# Patient Record
Sex: Male | Born: 1960 | Race: White | Hispanic: No | Marital: Married | Smoking: Never smoker
Health system: Southern US, Academic
[De-identification: ages and names within clinical notes are randomized; demographics above are authoritative.]

## PROBLEM LIST (undated history)

## (undated) HISTORY — PX: HX BACK SURGERY: SHX140

---

## 1992-12-05 ENCOUNTER — Other Ambulatory Visit (HOSPITAL_COMMUNITY): Payer: Self-pay | Admitting: EXTERNAL

## 2020-09-09 IMAGING — MR MRI KNEE RT W/O CONTRAST
5 series · 40 of 40 positions shown · IV contrast (gadolinium)
Comparison: None available.

﻿EXAM:  [id number]   MRI KNEE RT W/O CONTRAST
INDICATION: Chronic pain.
TECHNIQUE: Multiplanar multisequential MRI of the right knee joint was performed without gadolinium contrast.

[Series 5: PD fat-sat · axial · right · 4.0mm · 0.53mm/px · z∈[-67,+63]mm · 8 of 30 slices shown (1 of 3)]
[im 1/30]
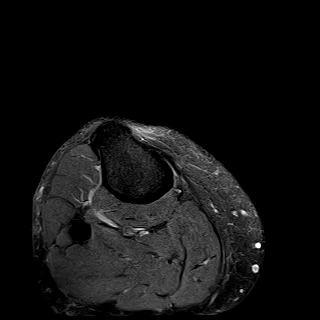
[im 5/30]
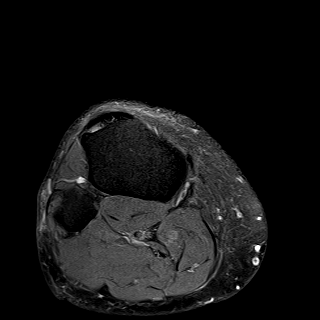
[im 9/30]
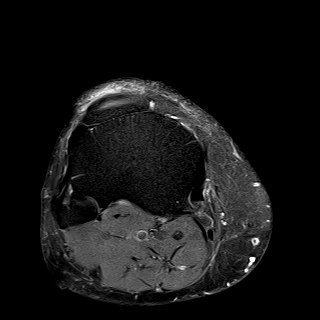
[im 13/30]
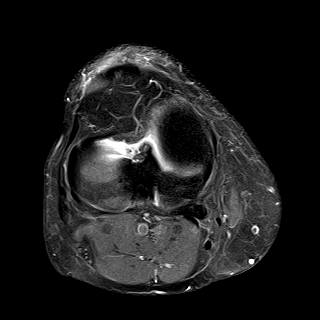
[im 17/30]
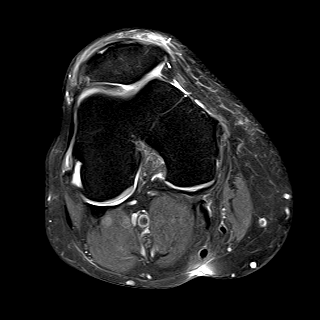
[im 21/30]
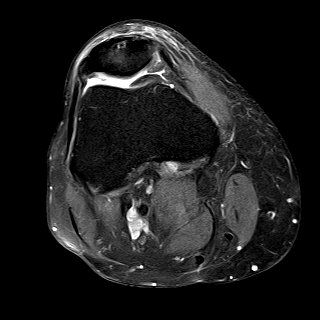
[im 25/30]
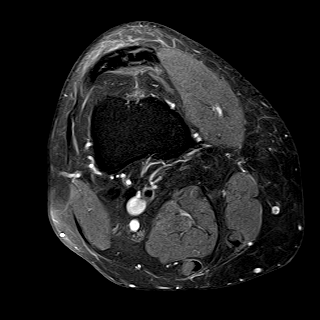
[im 30/30]
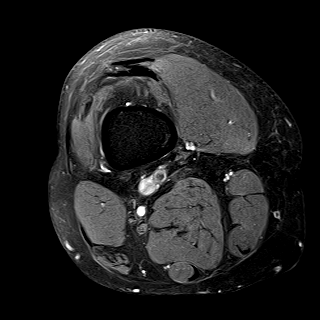

[Series 6: PD fat-sat · sagittal · right · 3.0mm · 0.47mm/px · 8 of 30 slices shown (2 of 3)]
[im 1/30]
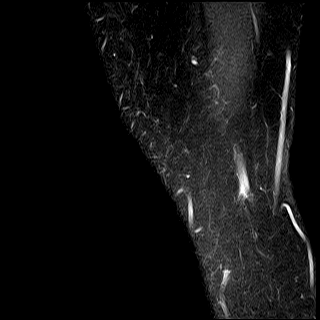
[im 5/30]
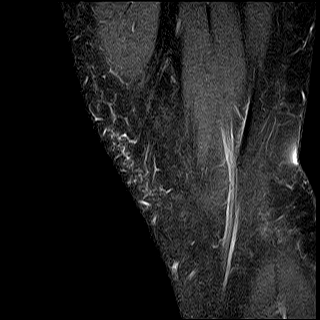
[im 9/30]
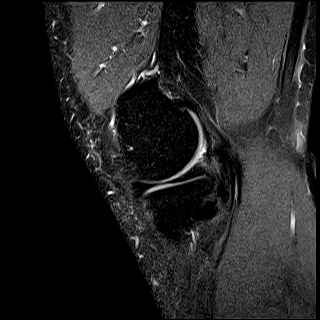
[im 13/30]
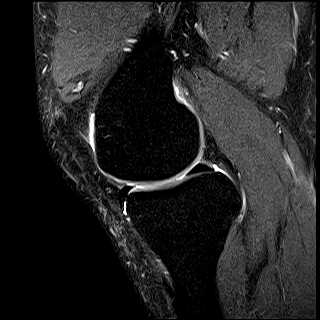
[im 17/30]
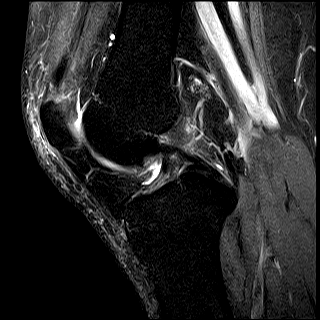
[im 21/30]
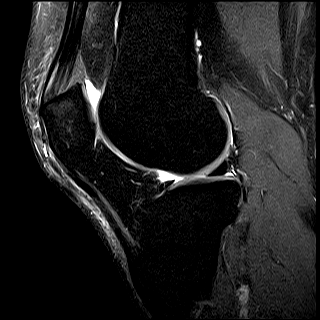
[im 25/30]
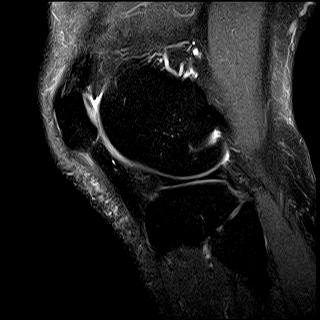
[im 30/30]
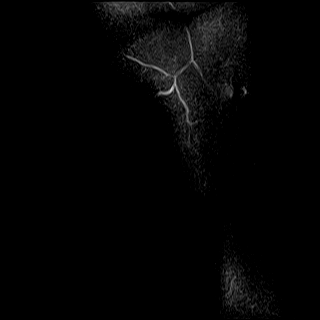

[Series 7: STIR · coronal · right · 3.0mm · 0.47mm/px · 8 of 29 slices shown]
[im 1/29]
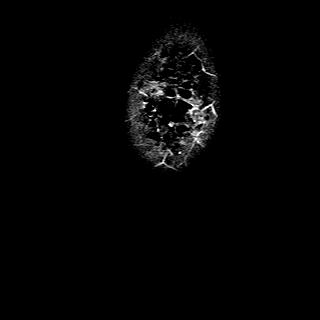
[im 5/29]
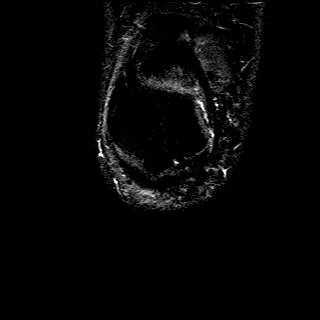
[im 9/29]
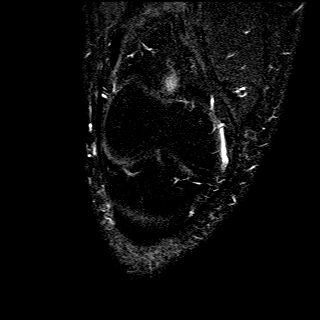
[im 13/29]
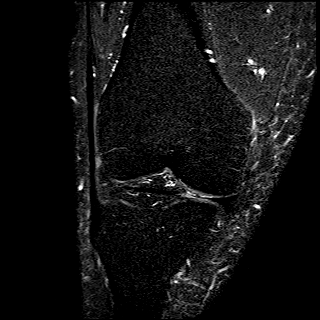
[im 17/29]
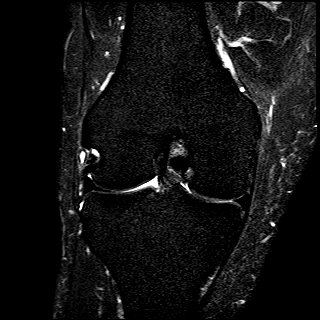
[im 21/29]
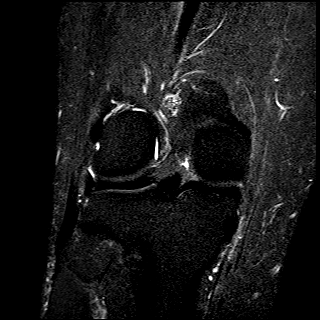
[im 25/29]
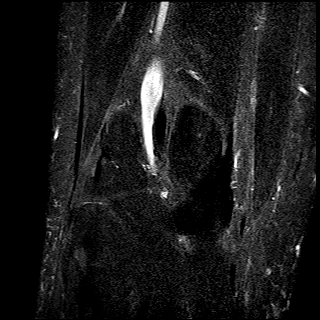
[im 29/29]
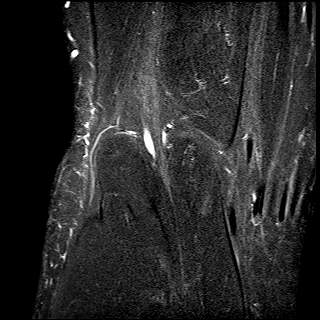

[Series 8: T1 · sagittal · right · 3.0mm · 0.39mm/px · 8 of 30 slices shown]
[im 1/30]
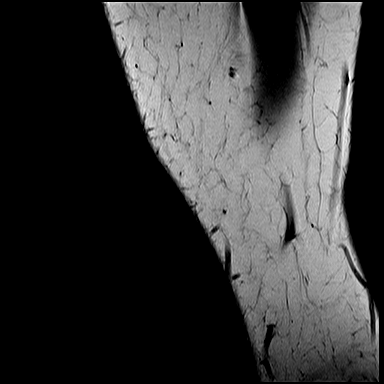
[im 5/30]
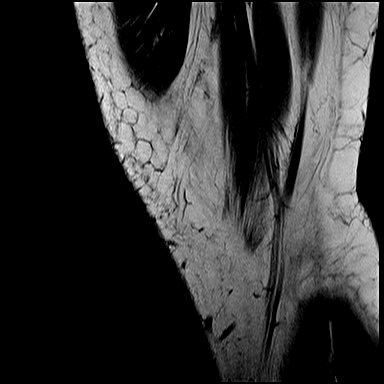
[im 9/30]
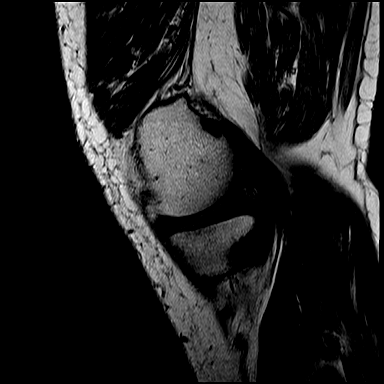
[im 13/30]
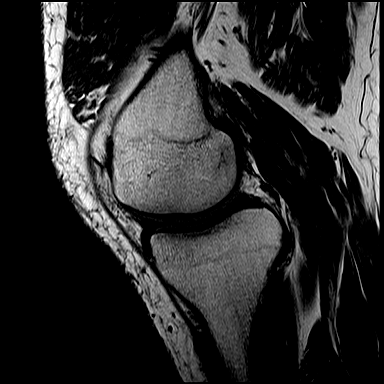
[im 17/30]
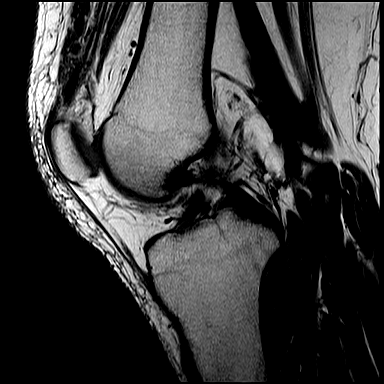
[im 21/30]
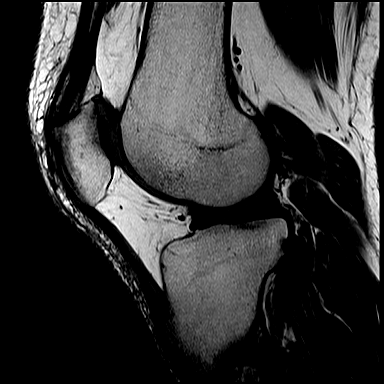
[im 25/30]
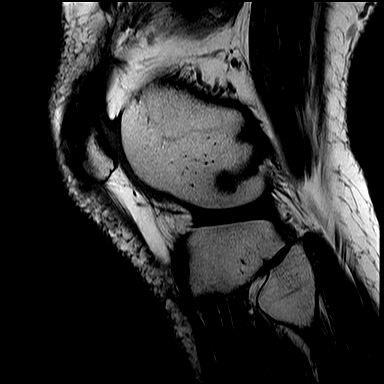
[im 30/30]
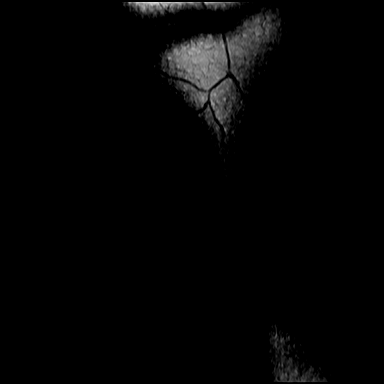

[Series 9: PD fat-sat · coronal · right · 3.0mm · 0.47mm/px · 8 of 29 slices shown (3 of 3)]
[im 1/29]
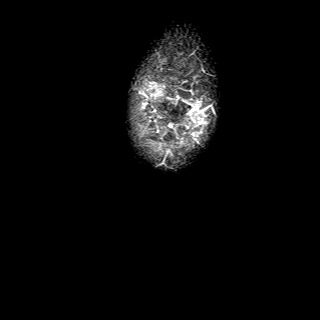
[im 5/29]
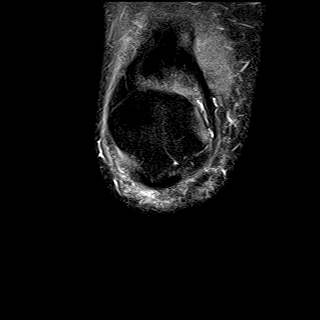
[im 9/29]
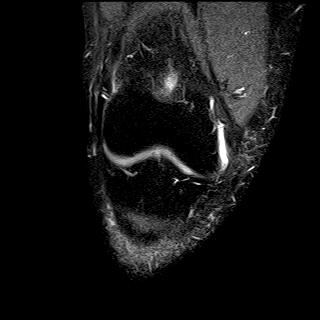
[im 13/29]
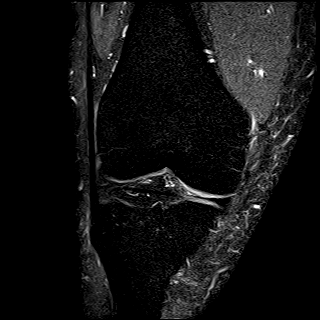
[im 17/29]
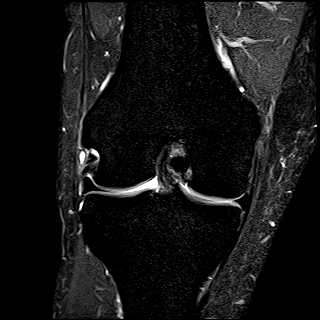
[im 21/29]
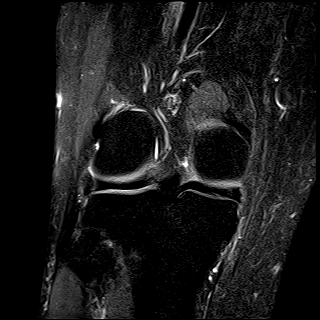
[im 25/29]
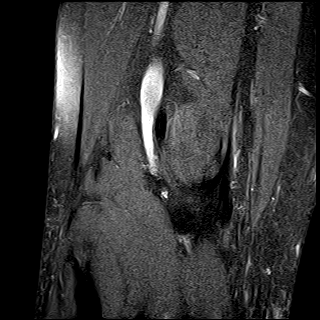
[im 29/29]
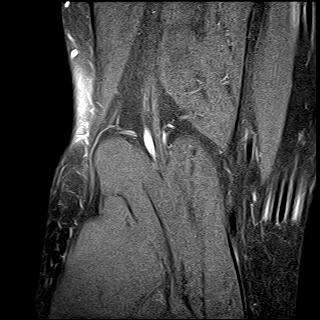

[40 of 40 positions shown; findings below may reference images not displayed]

FINDINGS: There is suggestion of a subtle tear involving the body of the medial meniscus. Lateral meniscus, cruciate and collateral ligaments are intact, within normal limits in morphology and signal intensity. Hyaline cartilage of the tibiofemoral and patellofemoral articulations is well maintained. Extensor mechanism is intact. There is moderate edema within the distal quadriceps tendon. Capsular attachments appear unremarkable. Bone marrow signal intensity is normal. There is no suprapatellar effusion or Baker's cyst.
IMPRESSION: 1. Suggestion of a subtle tear involving the body of the medial meniscus. 

2. Moderate distal quadriceps tendinopathy.

## 2020-09-09 IMAGING — MR MRI KNEE LT W/O CONTRAST
5 series · 40 of 40 positions shown · IV contrast (gadolinium)
Comparison: None available.

﻿EXAM:  [id number]   MRI KNEE LT W/O CONTRAST
INDICATION: Chronic pain.
TECHNIQUE: Multiplanar multisequential MRI of the left knee joint was performed without gadolinium contrast.

[Series 5: PD fat-sat · axial · left · 4.0mm · 0.53mm/px · z∈[-74,+57]mm · 8 of 30 slices shown (1 of 3)]
[im 1/30]
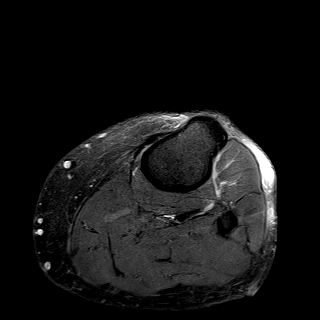
[im 5/30]
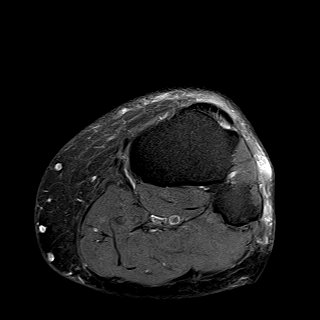
[im 9/30]
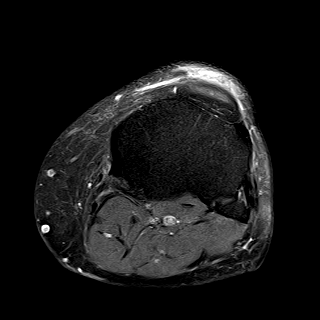
[im 13/30]
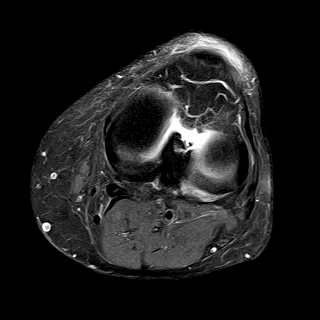
[im 17/30]
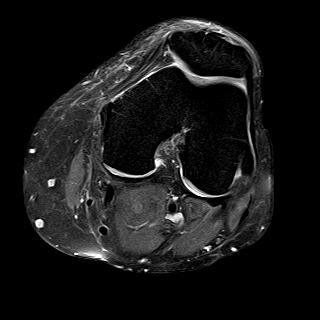
[im 21/30]
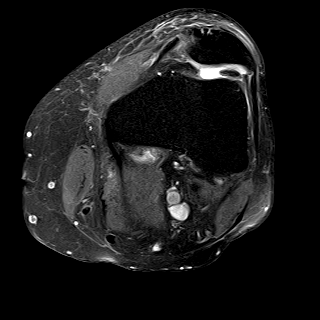
[im 25/30]
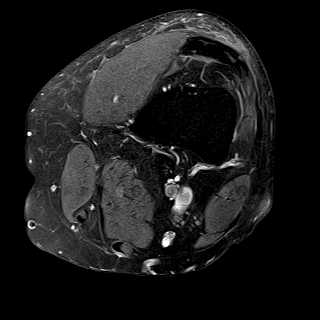
[im 30/30]
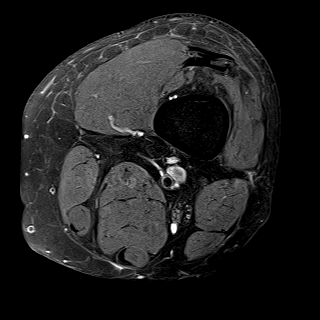

[Series 6: PD fat-sat · sagittal · left · 3.0mm · 0.47mm/px · 8 of 30 slices shown (2 of 3)]
[im 1/30]
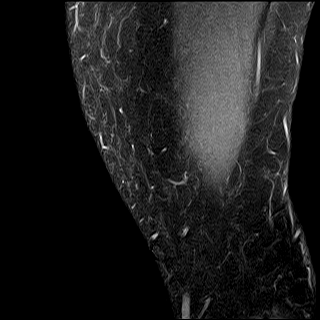
[im 5/30]
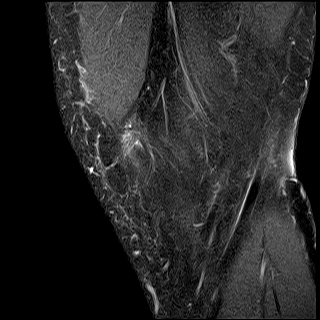
[im 9/30]
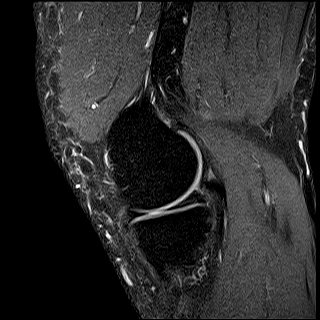
[im 13/30]
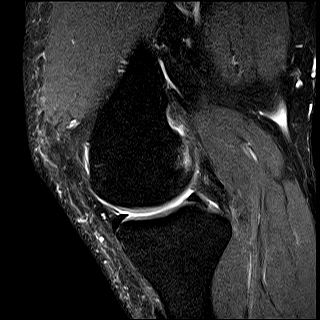
[im 17/30]
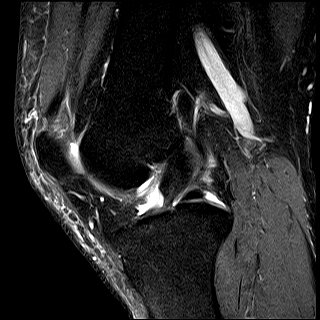
[im 21/30]
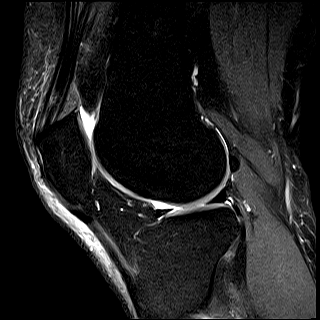
[im 25/30]
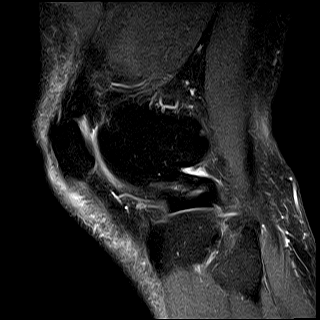
[im 30/30]
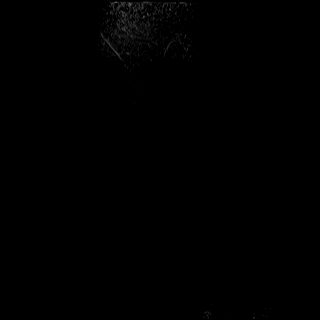

[Series 7: STIR · coronal · left · 3.0mm · 0.47mm/px · 8 of 29 slices shown]
[im 1/29]
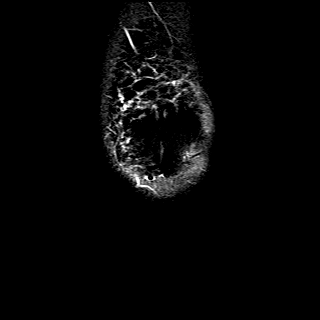
[im 5/29]
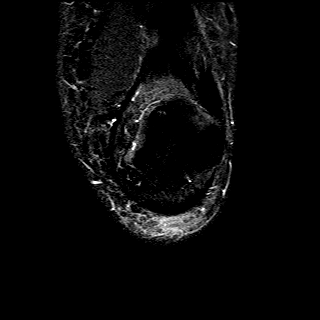
[im 9/29]
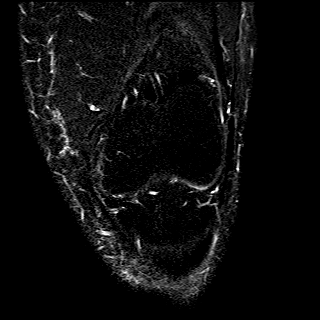
[im 13/29]
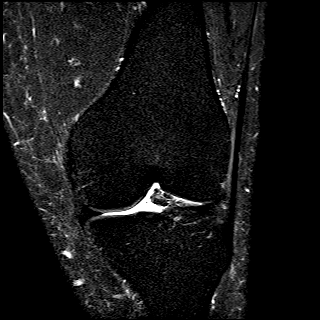
[im 17/29]
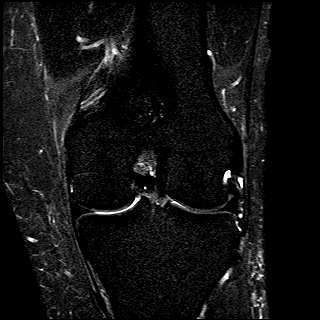
[im 21/29]
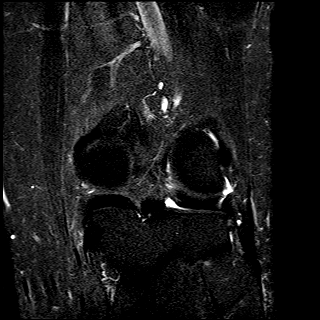
[im 25/29]
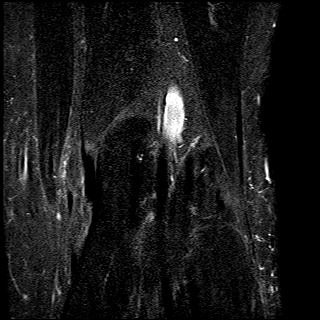
[im 29/29]
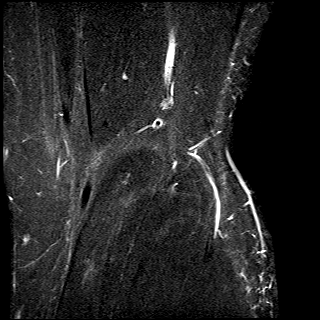

[Series 8: T1 · sagittal · left · 3.0mm · 0.39mm/px · 8 of 30 slices shown]
[im 1/30]
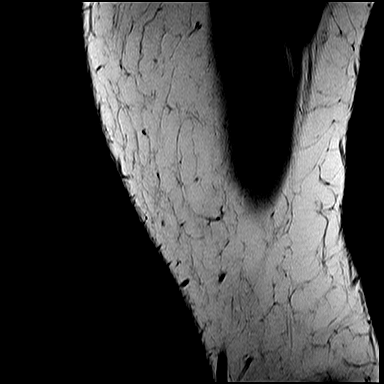
[im 5/30]
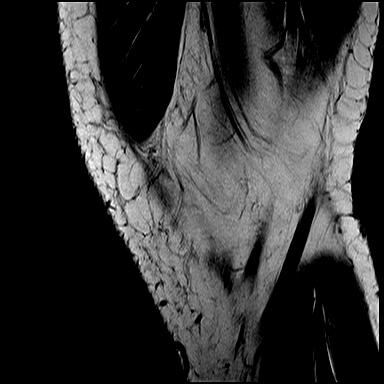
[im 9/30]
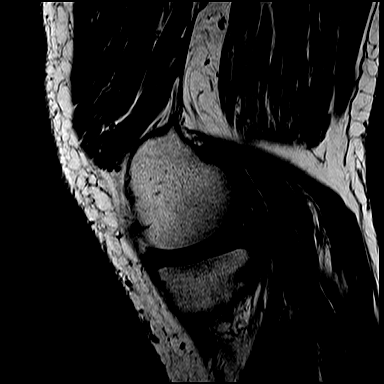
[im 13/30]
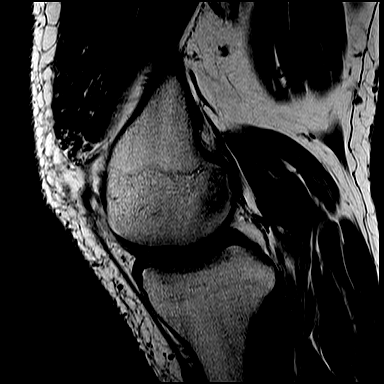
[im 17/30]
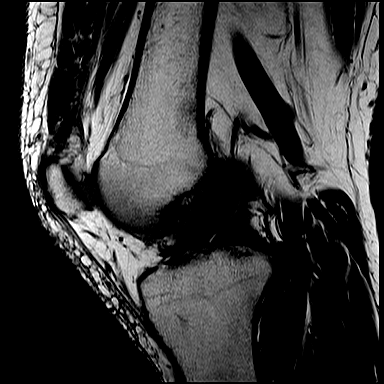
[im 21/30]
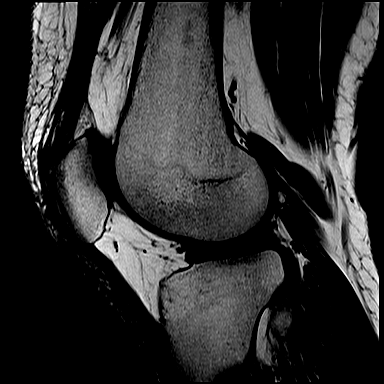
[im 25/30]
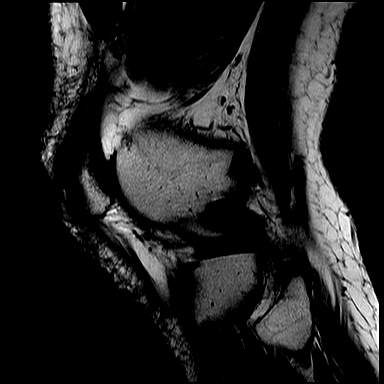
[im 30/30]
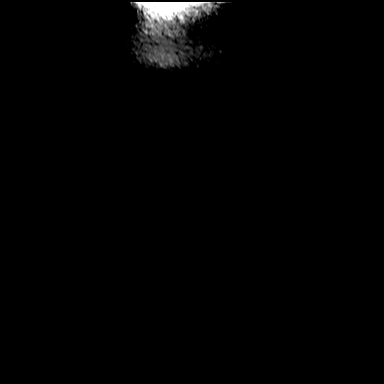

[Series 9: PD fat-sat · coronal · left · 3.0mm · 0.47mm/px · 8 of 29 slices shown (3 of 3)]
[im 1/29]
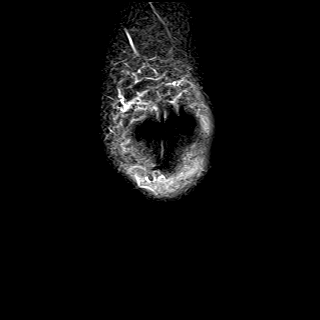
[im 5/29]
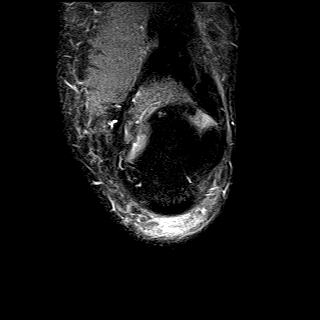
[im 9/29]
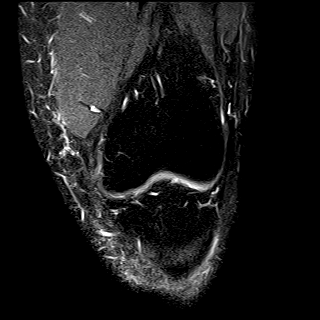
[im 13/29]
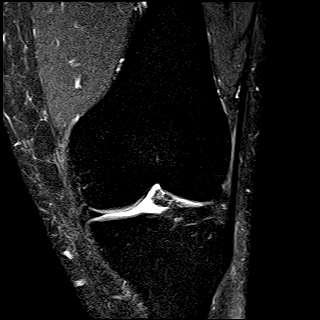
[im 17/29]
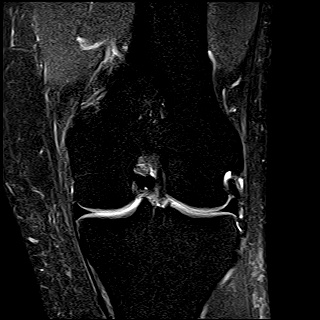
[im 21/29]
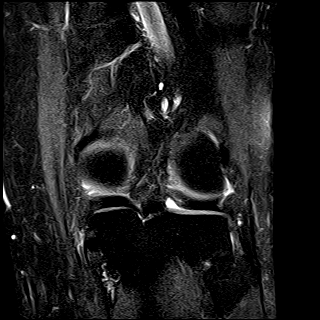
[im 25/29]
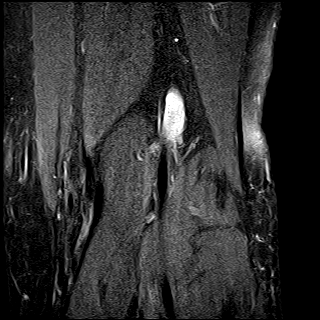
[im 29/29]
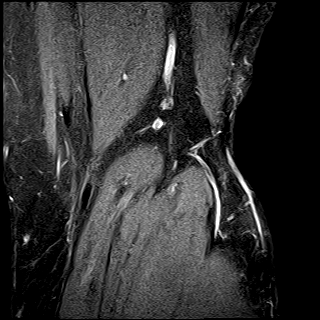

[40 of 40 positions shown; findings below may reference images not displayed]

FINDINGS: Menisci, cruciate and collateral ligaments are intact, within normal limits in morphology and signal intensity. Hyaline cartilage of the tibiofemoral and patellofemoral articulations is also well maintained. Extensor mechanism is intact. Capsular attachments appear unremarkable. Bone marrow signal intensity is normal. There is no suprapatellar effusion or Baker's cyst. There is mild prepatellar subcutaneous edema.
IMPRESSION: Mild prepatellar subcutaneous edema, otherwise unremarkable exam.

## 2020-09-25 IMAGING — MR MRI LUMBAR SPINE WITHOUT CONTRAST
6 series · 48 of 48 positions shown · IV contrast (gadolinium)
Comparison: None available.

﻿EXAM:  [id number]   MRI LUMBAR SPINE WITHOUT CONTRAST
INDICATION: Persistent lower back pain with bilateral lower extremity weakness.
TECHNIQUE: Multiplanar multisequential MRI of the lumbar spine was performed without gadolinium contrast. A non conventional body coil was utilized due to patient's extremely large body habitus.

[Series 4: T2 · coronal · 4.0mm · 1.34mm/px · 8 of 20 slices shown (1 of 3)]
[im 1/20]
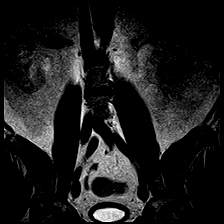
[im 3/20]
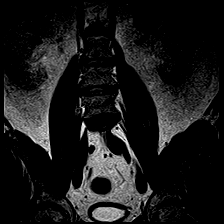
[im 6/20]
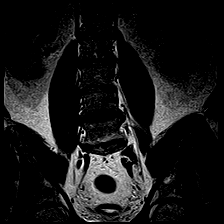
[im 9/20]
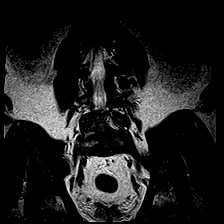
[im 11/20]
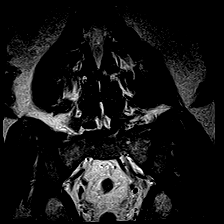
[im 14/20]
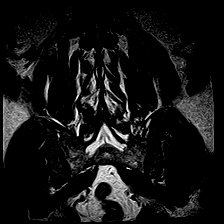
[im 17/20]
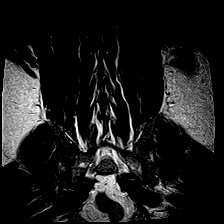
[im 20/20]
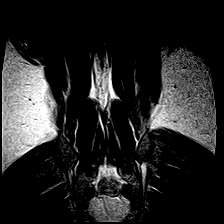

[Series 5: T2 · sagittal · 5.0mm · 1.06mm/px · 6 of 13 slices shown (2 of 3)]
[im 1/13]
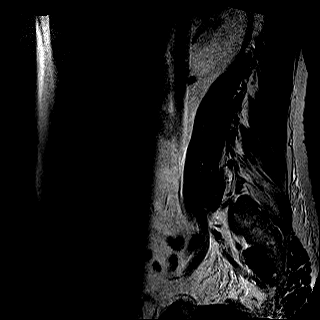
[im 3/13]
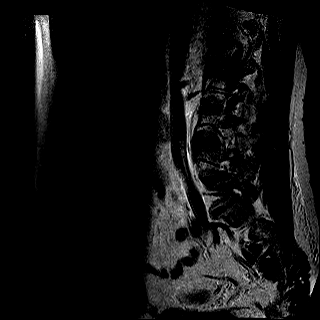
[im 5/13]
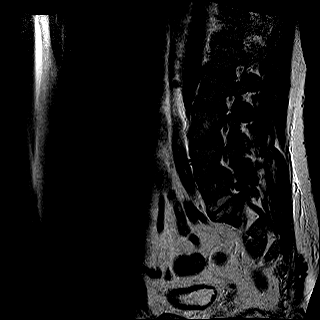
[im 8/13]
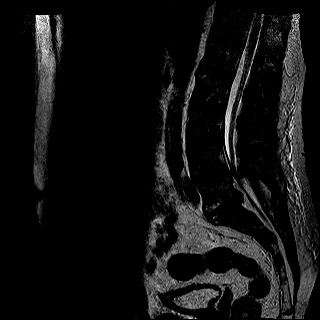
[im 10/13]
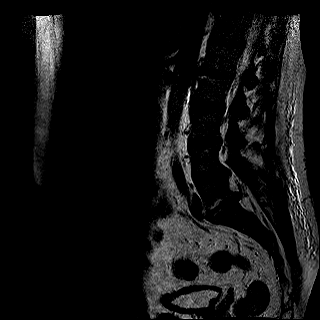
[im 13/13]
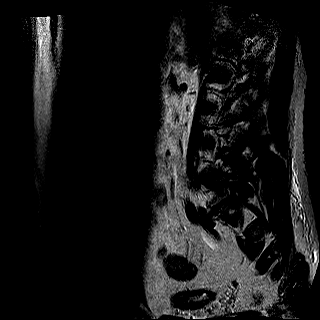

[Series 6: T1 · sagittal · 5.0mm · 1.06mm/px · 6 of 13 slices shown (1 of 2)]
[im 1/13]
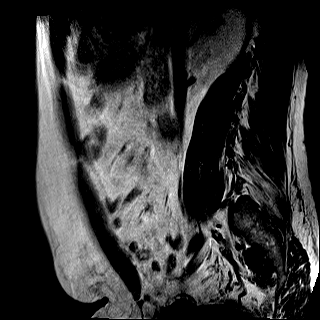
[im 3/13]
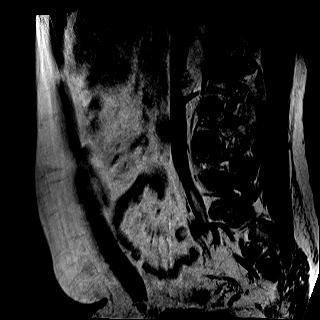
[im 5/13]
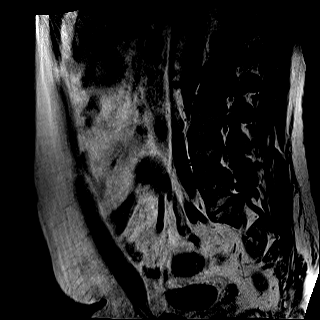
[im 8/13]
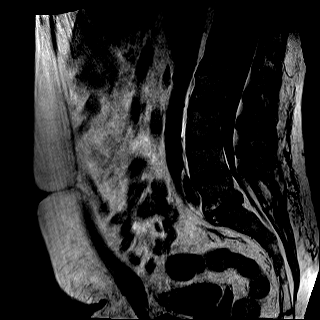
[im 10/13]
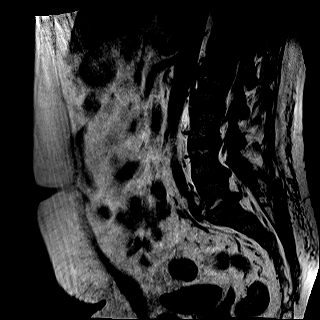
[im 13/13]
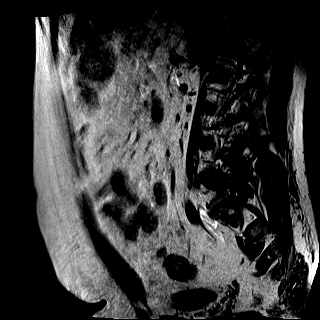

[Series 8: STIR · sagittal · 5.0mm · 1.33mm/px · 6 of 13 slices shown]
[im 1/13]
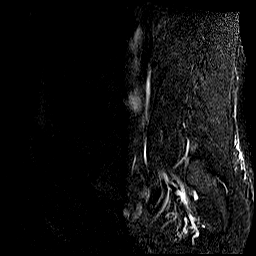
[im 3/13]
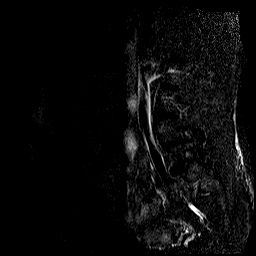
[im 5/13]
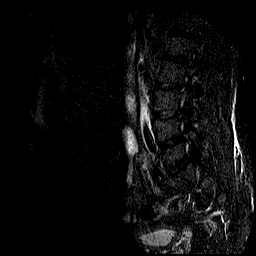
[im 8/13]
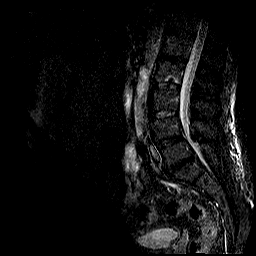
[im 10/13]
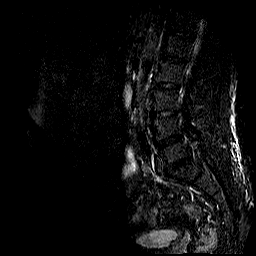
[im 13/13]
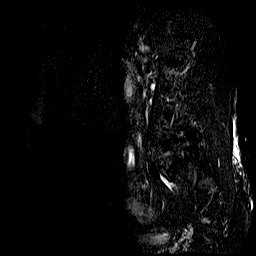

[Series 9: T2 · axial · 5.0mm · 0.89mm/px · z∈[-64,+139]mm · 11 of 25 slices shown (3 of 3)]
[im 1/25]
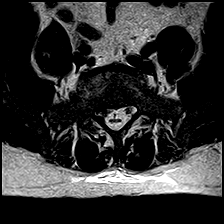
[im 3/25]
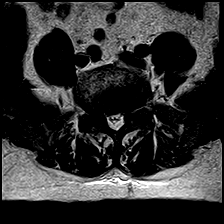
[im 5/25]
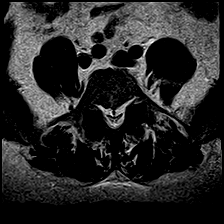
[im 8/25]
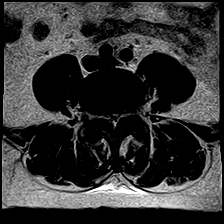
[im 10/25]
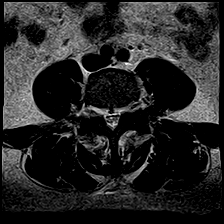
[im 13/25]
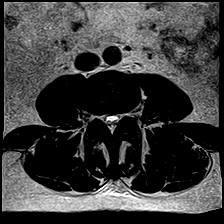
[im 15/25]
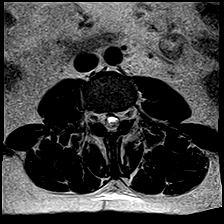
[im 17/25]
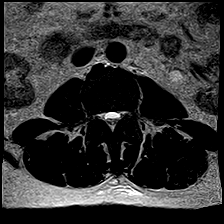
[im 20/25]
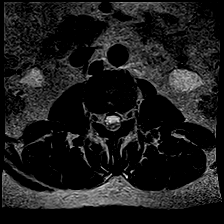
[im 22/25]
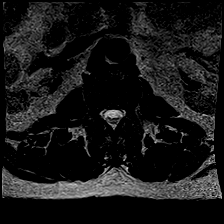
[im 25/25]
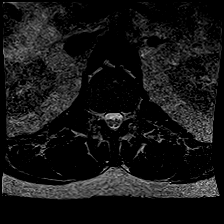

[Series 10: T1 · axial · 5.0mm · 0.89mm/px · z∈[-64,+139]mm · 11 of 25 slices shown (2 of 2)]
[im 1/25]
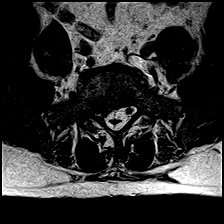
[im 3/25]
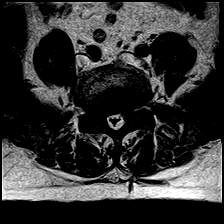
[im 5/25]
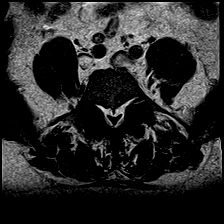
[im 8/25]
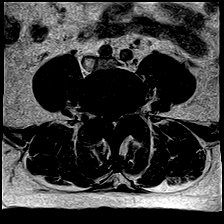
[im 10/25]
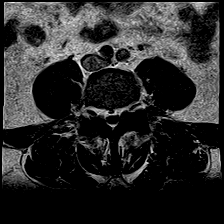
[im 13/25]
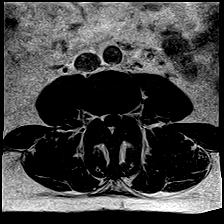
[im 15/25]
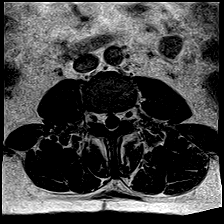
[im 17/25]
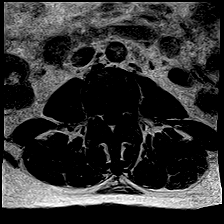
[im 20/25]
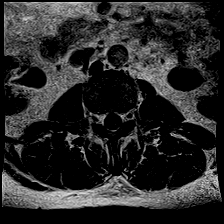
[im 22/25]
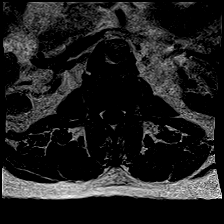
[im 25/25]
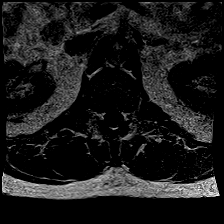

[48 of 48 positions shown; findings below may reference images not displayed]

FINDINGS: Vertebral bodies are normal in height, alignment and signal intensity. There is no acute fracture or subluxation. Distal spinal cord is normal in signal intensity and terminates normally at T12-L1 disc space level. Spinal canal is congenitally narrow.

L1-2, L2-3 and L3-4 levels are unremarkable.

At L4-5 level, is a minimal bulging annulus, minimally abutting the ventral thecal sac. There is severe spinal stenosis from epidural lipomatosis. There is mild-to-moderate bilateral neural foraminal stenosis from facet arthropathy and bulging annulus.

At L5-S1 level, there is marked disc desiccation. Modic type 2 changes are also noted within the inferior endplate of L5 vertebral body. There is a small broad-based central disc bulge without mass effect on the thecal sac. There is severe spinal stenosis from epidural lipomatosis. There is moderate to severe bilateral neural foraminal stenosis from facet arthropathy and bulging annulus.

Paraspinal soft tissues are unremarkable.
IMPRESSION: 1. No significant disc herniation at any level. 

2. Severe spinal stenosis at L4-5 and L5-S1 levels from epidural lipomatosis. 

3. Multilevel neural foraminal stenosis as detailed above.

## 2020-09-25 IMAGING — MR MRI CERVICAL SPINE WITHOUT CONTRAST
6 of 7 series · 28 of 48 positions shown · IV contrast (gadolinium)
Comparison: None available.

﻿EXAM:  [id number]   MRI CERVICAL SPINE WITHOUT CONTRAST
INDICATION: Neck pain with left upper extremity radiculopathy.
TECHNIQUE: Multiplanar multisequential MRI of the cervical spine was performed without gadolinium contrast.

[Series 4: s-map · sagittal · 8.8mm · 4.38mm/px · 8 of 100 slices shown]
[im 5/100]
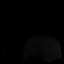
[im 18/100]
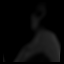
[im 31/100]
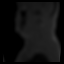
[im 44/100]
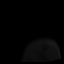
[im 56/100]
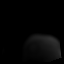
[im 69/100]
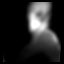
[im 82/100]
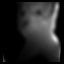
[im 95/100]
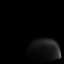

[Series 5: T2 · sagittal · 3.0mm · 0.75mm/px · 4 of 15 slices shown (1 of 3)]
[im 1/15]
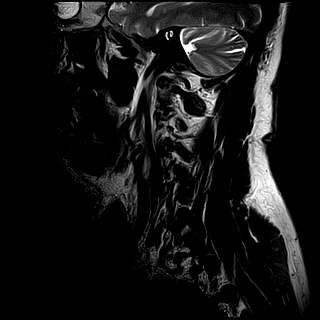
[im 5/15]
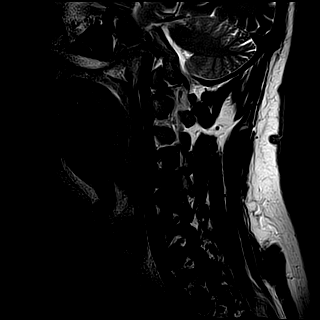
[im 10/15]
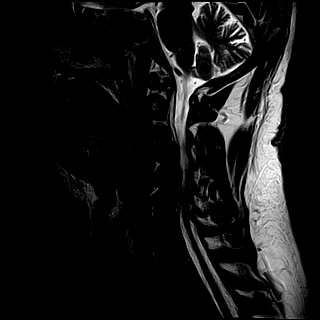
[im 15/15]
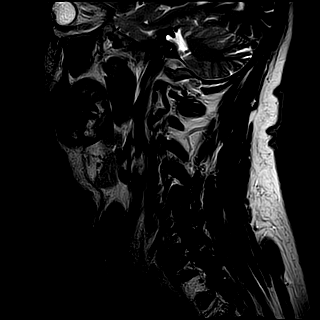

[Series 6: T1 · sagittal · 3.0mm · 0.47mm/px · 4 of 15 slices shown]
[im 1/15]
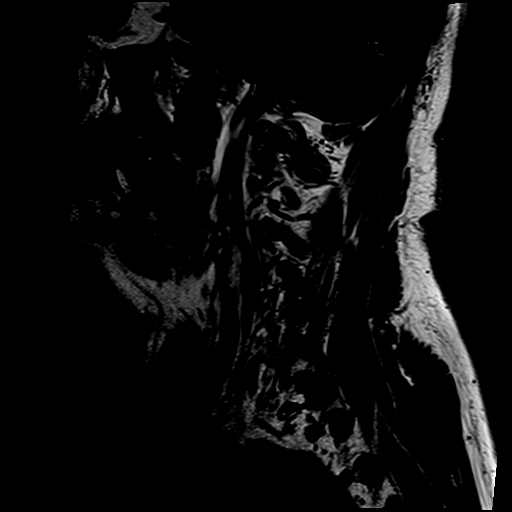
[im 5/15]
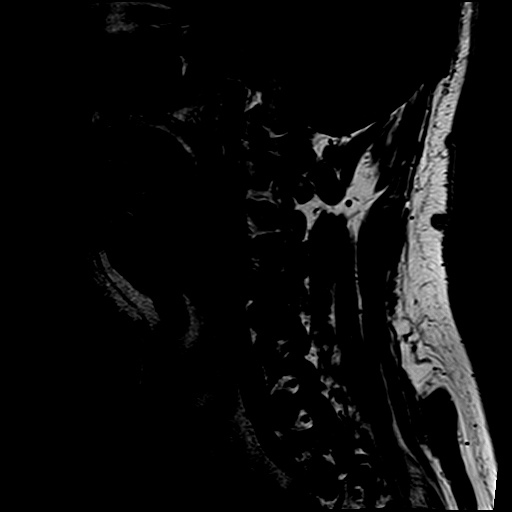
[im 10/15]
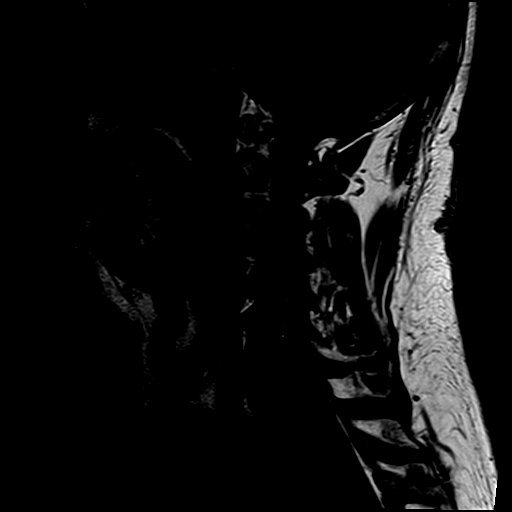
[im 15/15]
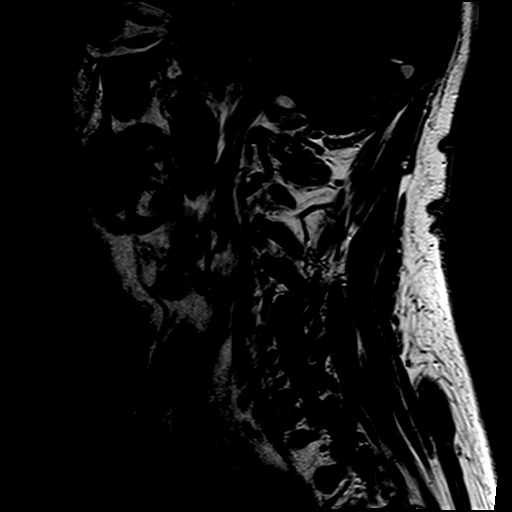

[Series 8: STIR · sagittal · 3.0mm · 0.47mm/px · 4 of 15 slices shown]
[im 1/15]
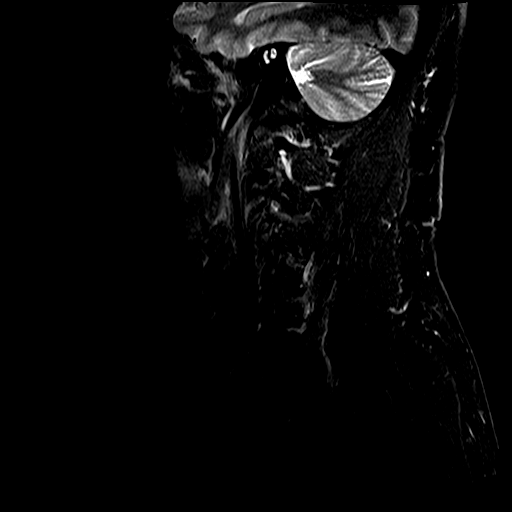
[im 5/15]
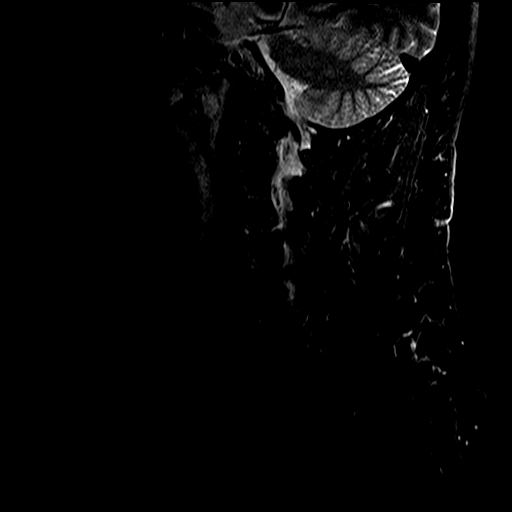
[im 10/15]
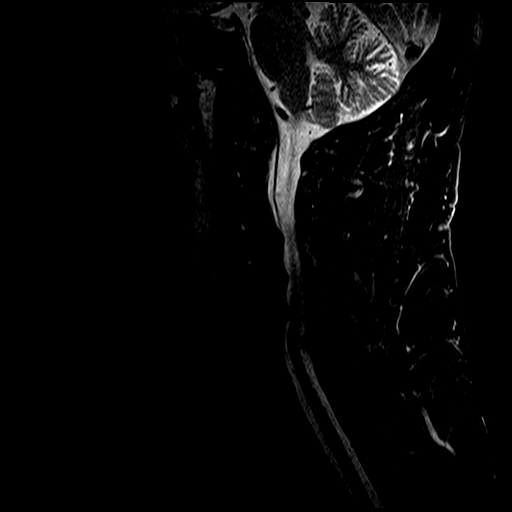
[im 15/15]
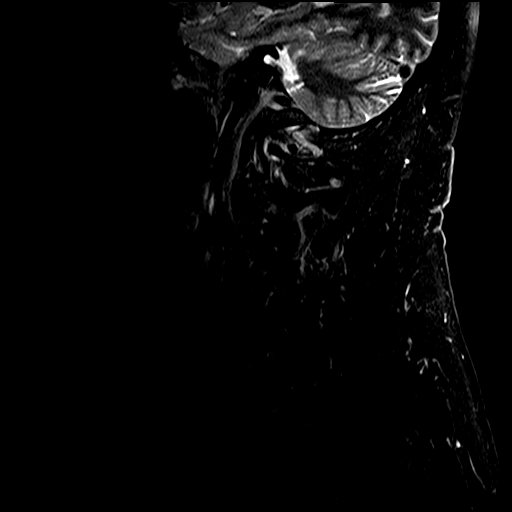

[Series 9: T2 · axial · 3.0mm · 0.39mm/px · z∈[-106,+2]mm · 4 of 18 slices shown (2 of 3)]
[im 1/18]
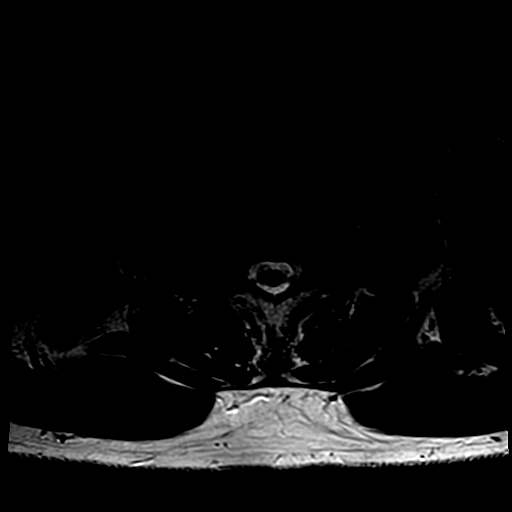
[im 6/18]
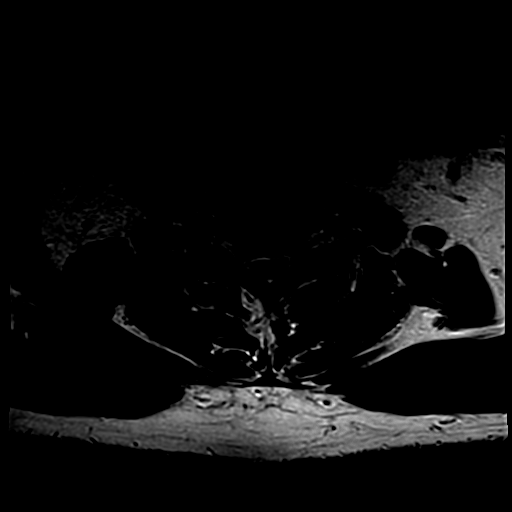
[im 12/18]
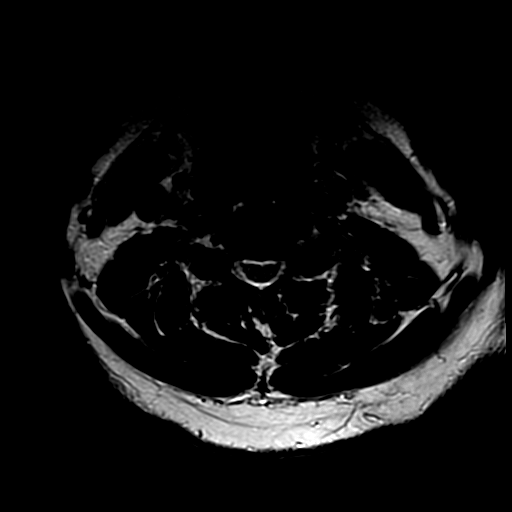
[im 18/18]
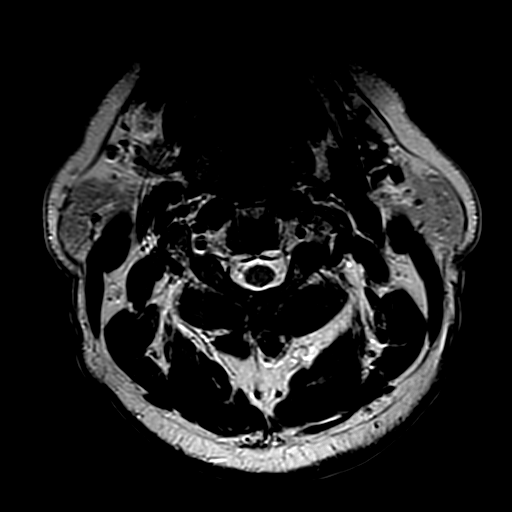

[Series 10: T2 · axial · 3.0mm · 0.35mm/px · z∈[-2,+58]mm · 4 of 16 slices shown (3 of 3)]
[im 1/16]
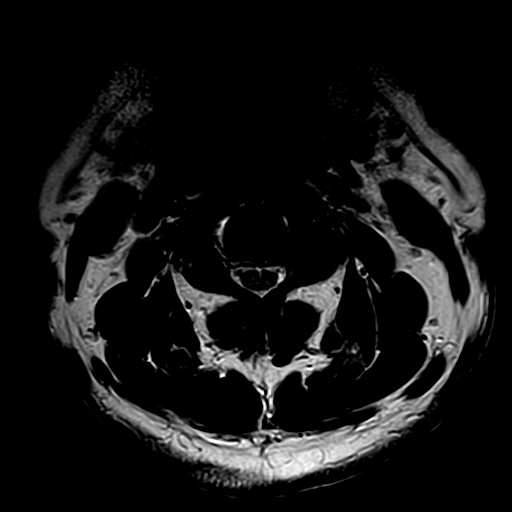
[im 6/16]
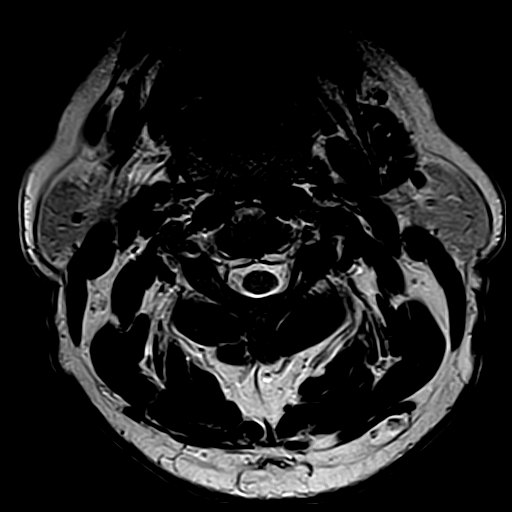
[im 11/16]
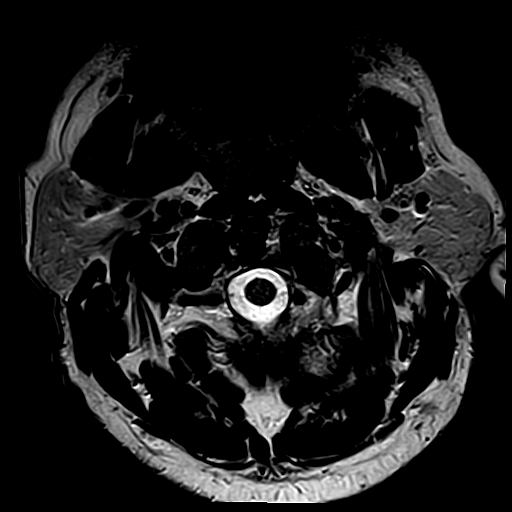
[im 16/16]
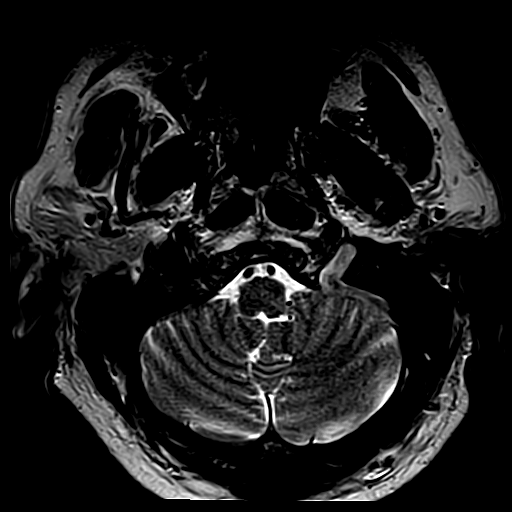

[28 of 48 positions shown; findings below may reference images not displayed]

FINDINGS: Reversal of cervical lordosis is likely positional. Bone marrow signal intensity is normal. There is no acute fracture or subluxation. Visualized spinal cord is normal in signal intensity without evidence of compression at any level.

C2-3 level is unremarkable.

At C3-4 level, there is a minimal bulging annulus, minimally effacing the ventral CSF. There is severe right neural foraminal stenosis from facet and uncovertebral joint hypertrophy.

At C4-5 level, there is a small broad-based central disc bulge with near complete effacement of the ventral CSF. There is severe bilateral neural foraminal stenosis from facet and uncovertebral joint hypertrophy.

At C5-6 level, there is a small broad-based central disc osteophyte complex resulting in mild-to-moderate spinal stenosis. There is severe bilateral neural foraminal stenosis from facet and uncovertebral joint hypertrophy.

At C6-7 level, there is a small broad-based central disc bulge partially effacing the ventral CSF. There is mild left neural foraminal stenosis from uncovertebral joint hypertrophy.

C7-T1 level and paraspinal soft tissues are unremarkable.
IMPRESSION: 1. Mild-to-moderate spinal stenosis C5-6 level from a small central disc osteophyte complex. No spinal cord signal abnormality is seen at this time to suggest cord edema or myelomalacia. Continued close clinical follow-up and neurosurgical evaluation are recommended. 

2. Multilevel neural foraminal stenosis as detailed above.

## 2020-10-29 IMAGING — CR XRAY CERVICAL SPINE MINIMUM 4 VIEWS
1 series · 4 of 4 positions shown · non-contrast
Comparison: None available.

﻿EXAM:  [id number]      XRAY CERVICAL SPINE MINIMUM 4 VIEWS
INDICATION: Neck pain.

[Series 1: view not recorded · 0.17mm/px · 4 of 4 slices shown]
[im 1/4]
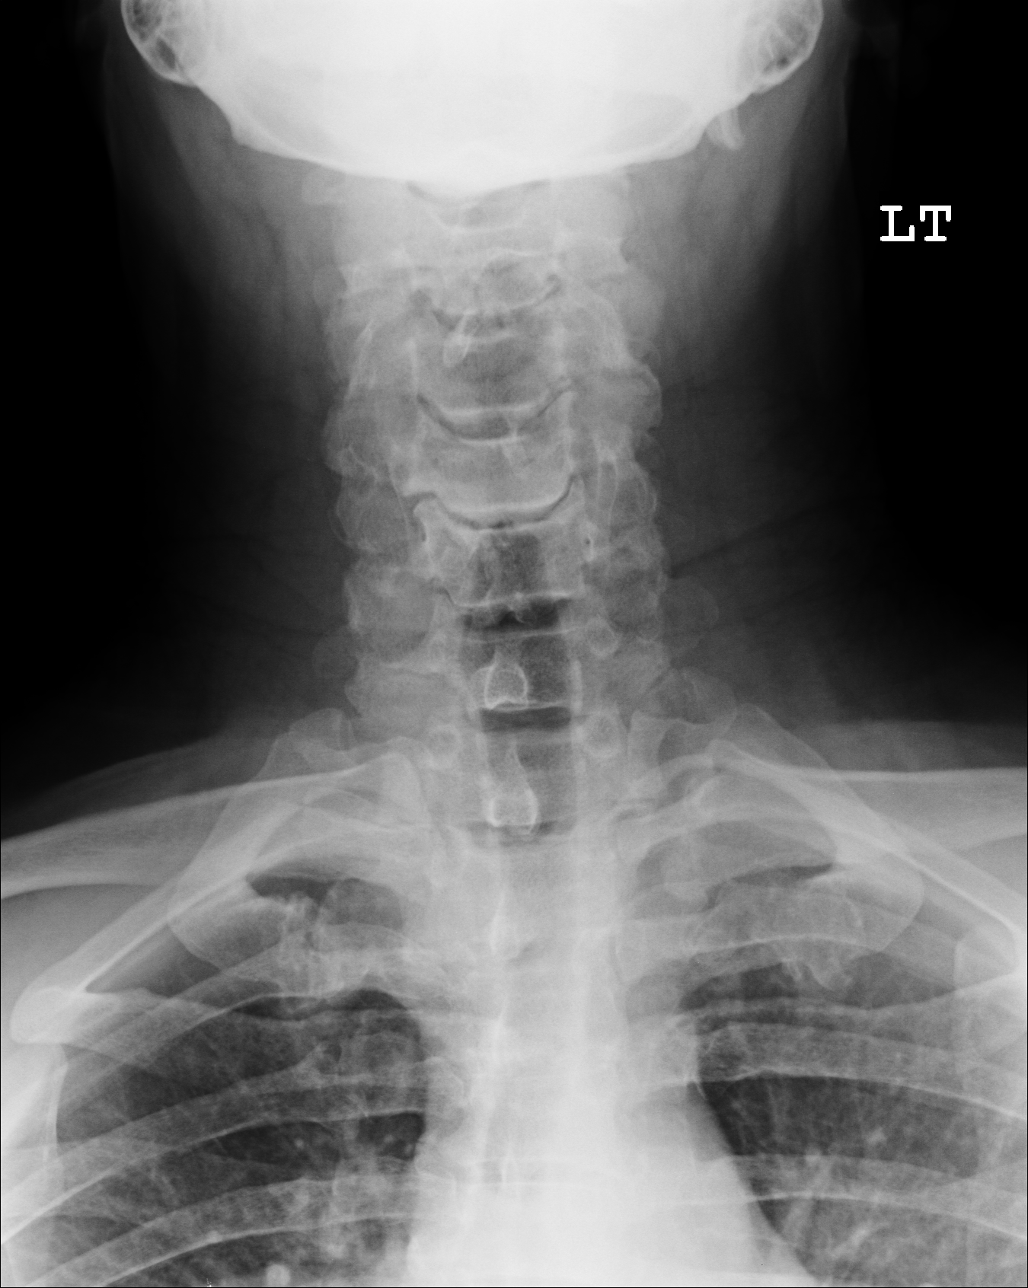
[im 2/4]
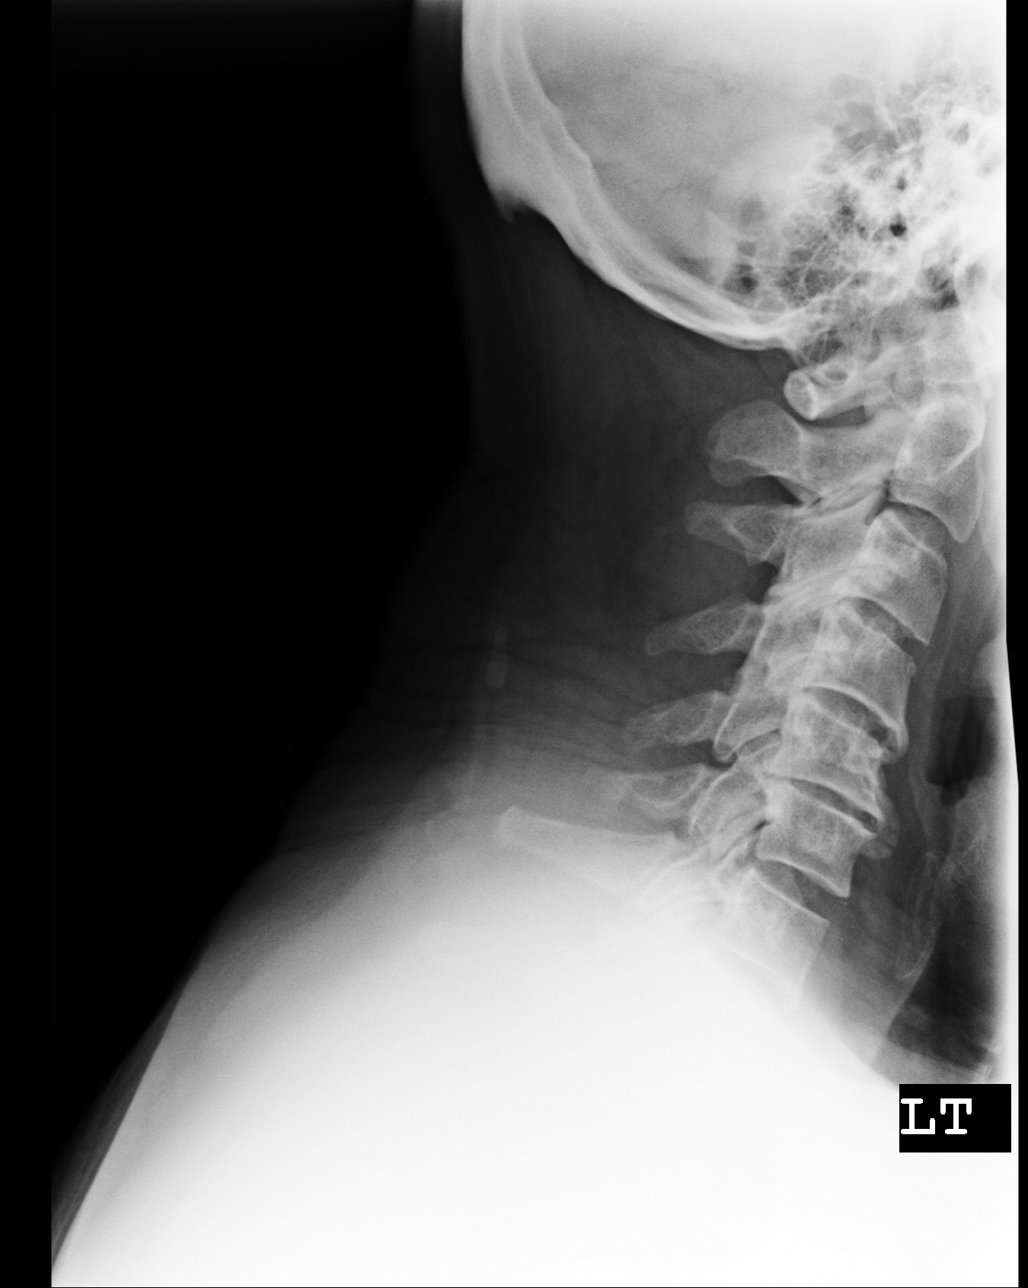
[im 3/4]
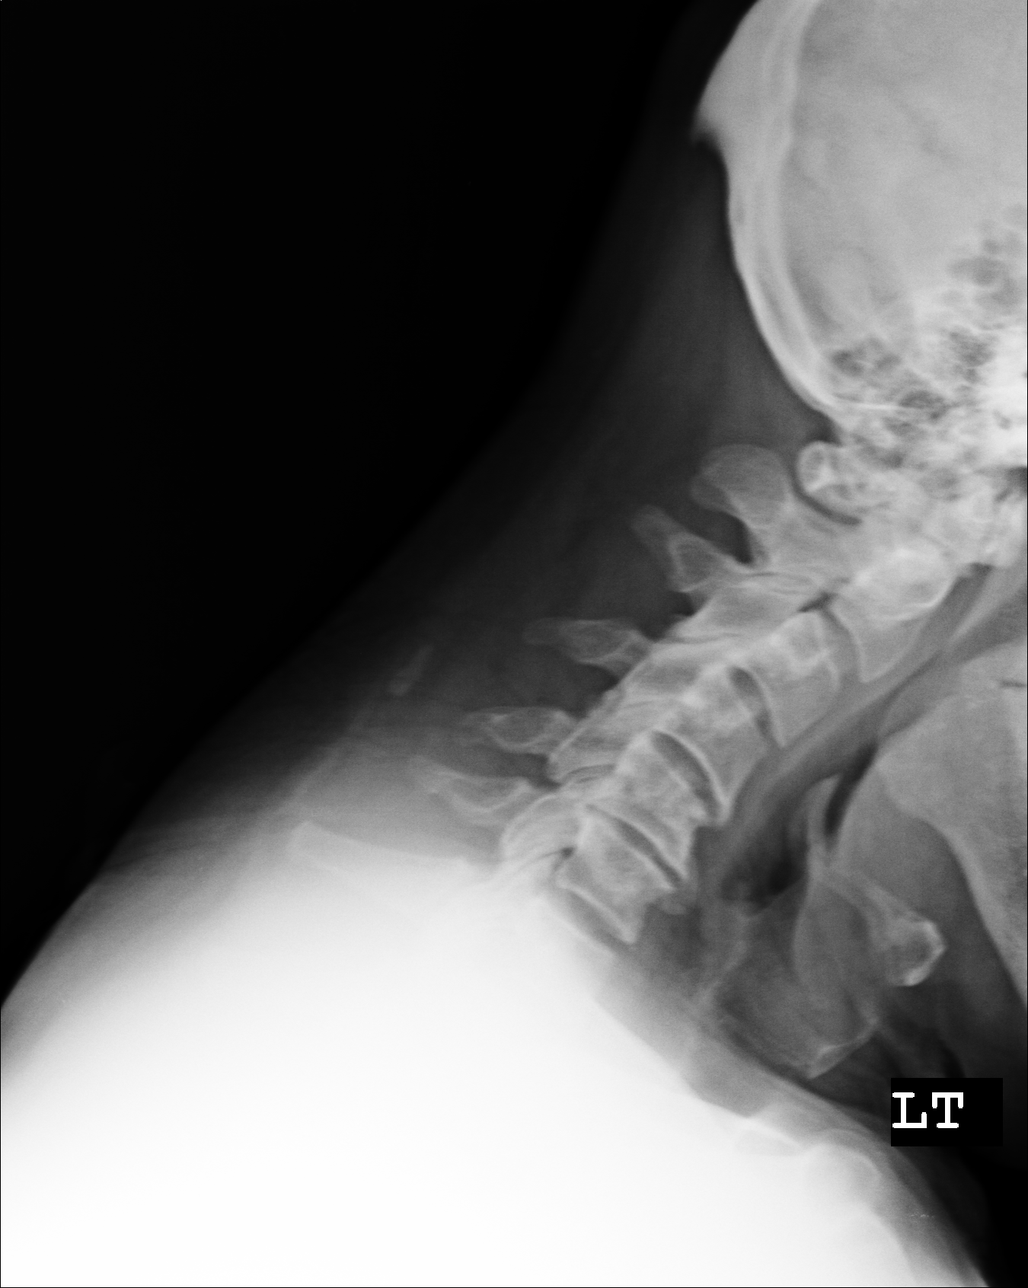
[im 4/4]
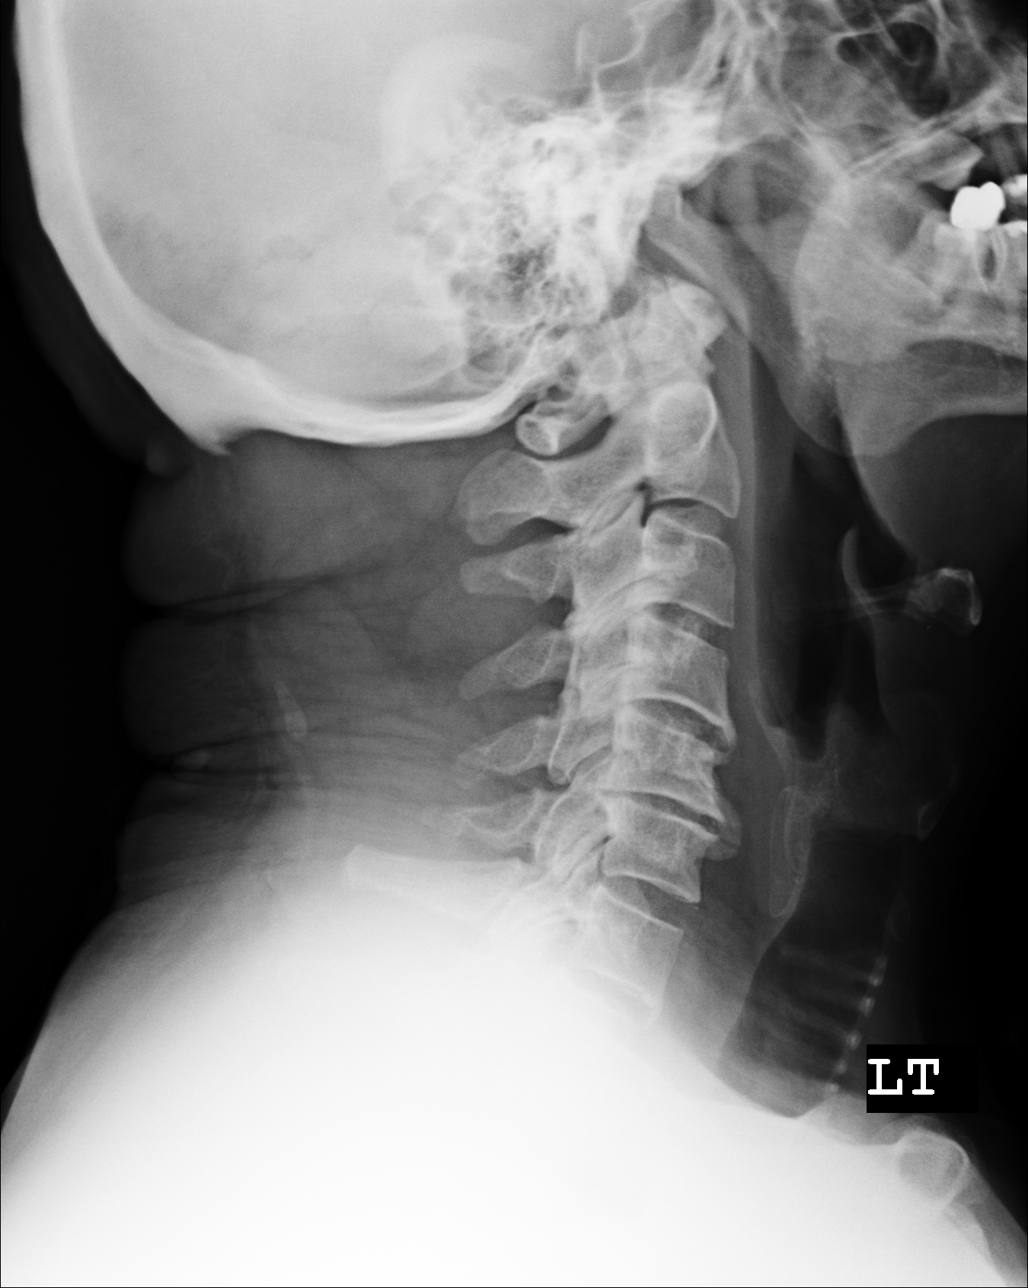

[4 of 4 positions shown; findings below may reference images not displayed]

FINDINGS: Mild reversal of cervical lordosis is likely positional. There is no acute fracture or subluxation. There is moderate degenerative disc disease at C5-6 level. No definite abnormality is seen on the flexion and extension views. Atlantoaxial articulation appears well maintained. There is no prevertebral soft tissue swelling.
IMPRESSION: Degenerative changes as detailed above.

## 2020-10-29 IMAGING — CR XRAY KNEE 4 OR MORE VIEWS LT
1 series · 3 of 3 positions shown · non-contrast
Comparison: None available.

﻿EXAM:  XRAY KNEE 4 OR MORE VIEWS LT,XRAY KNEE 4 OR MORE VIEWS RT
INDICATION: Bilateral knee pain.

[Series 1: view not recorded · 0.17mm/px · 3 of 3 slices shown]
[im 1/3]
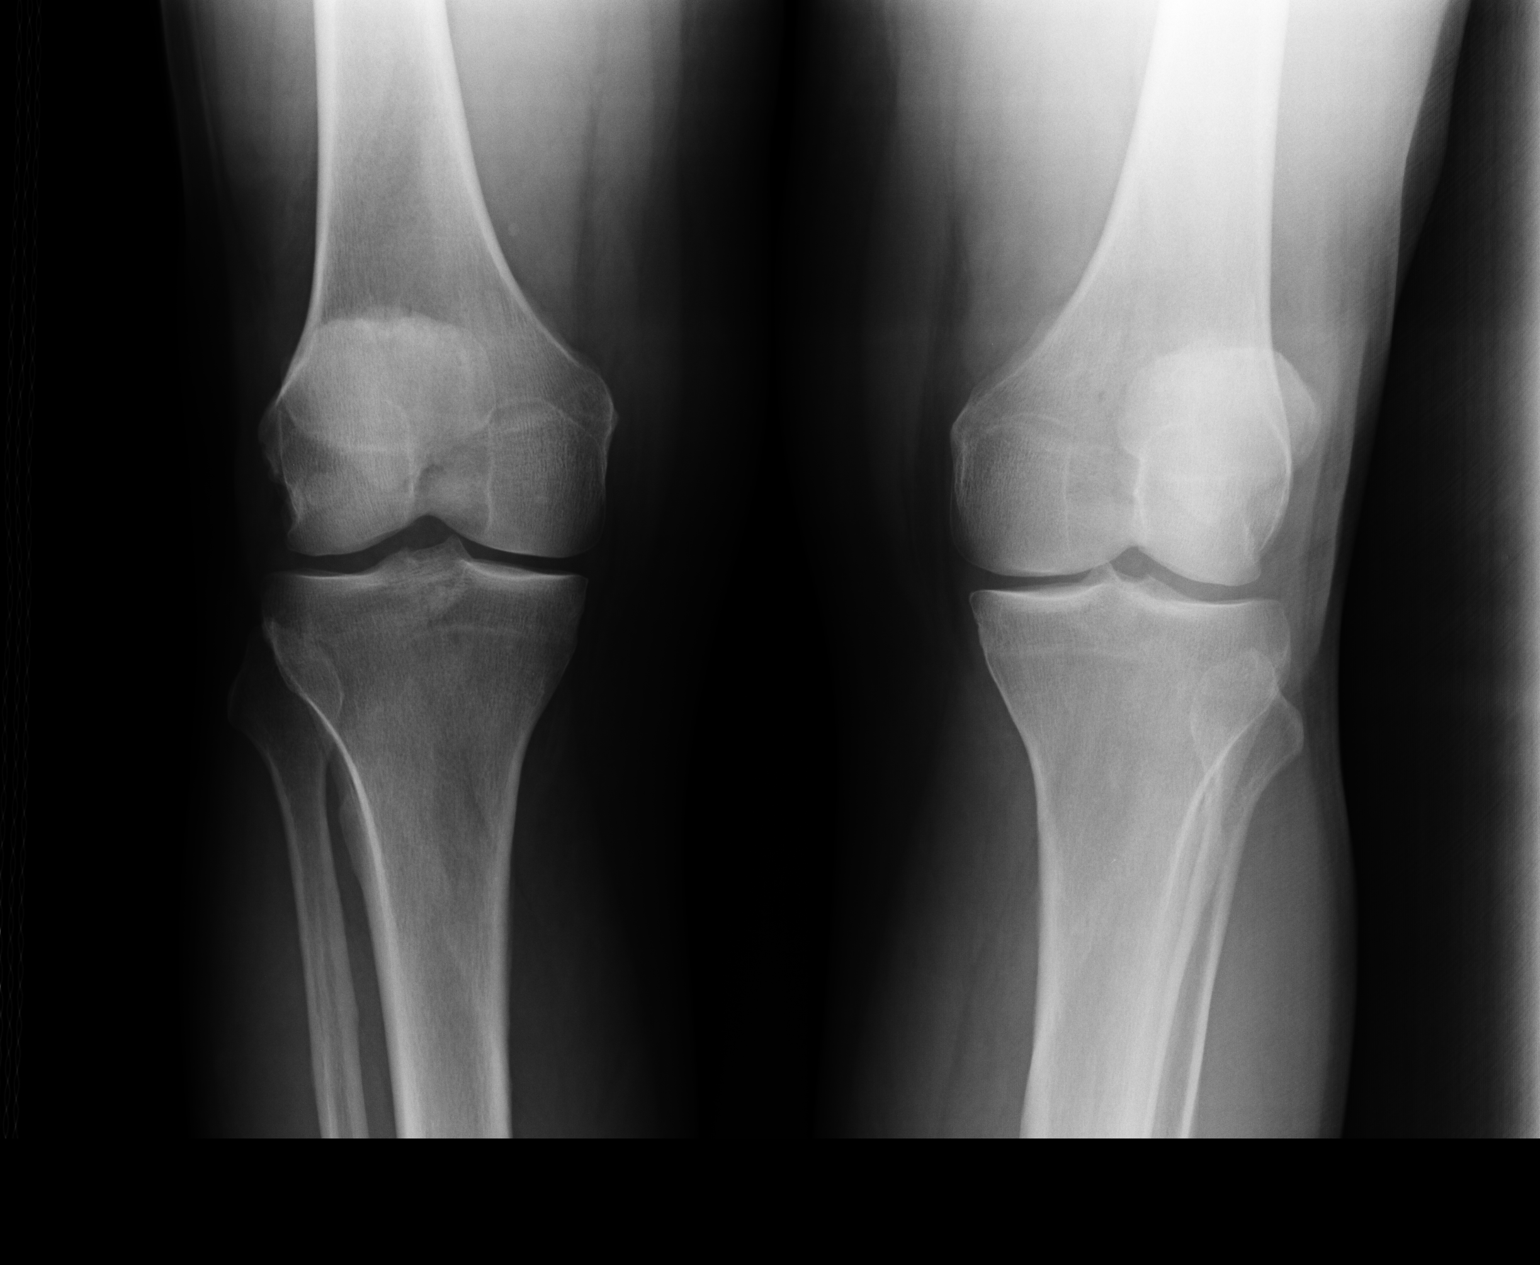
[im 2/3]
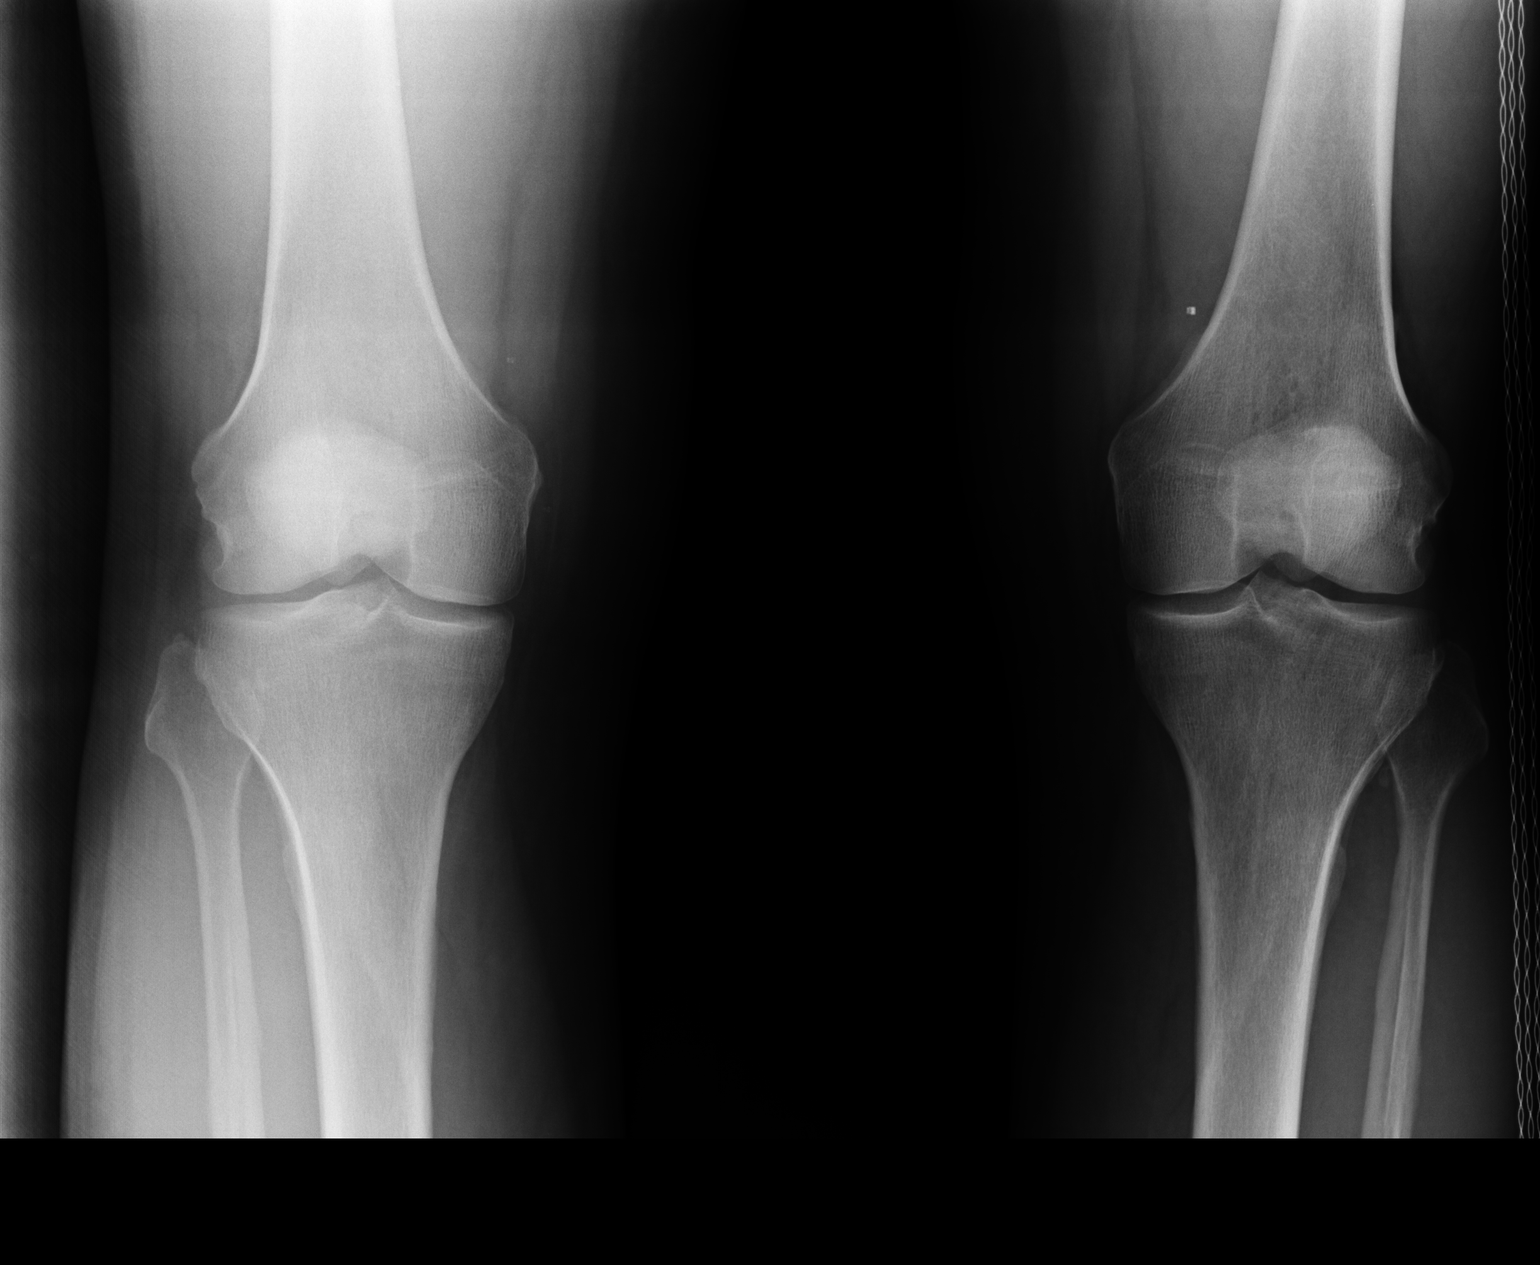
[im 3/3]
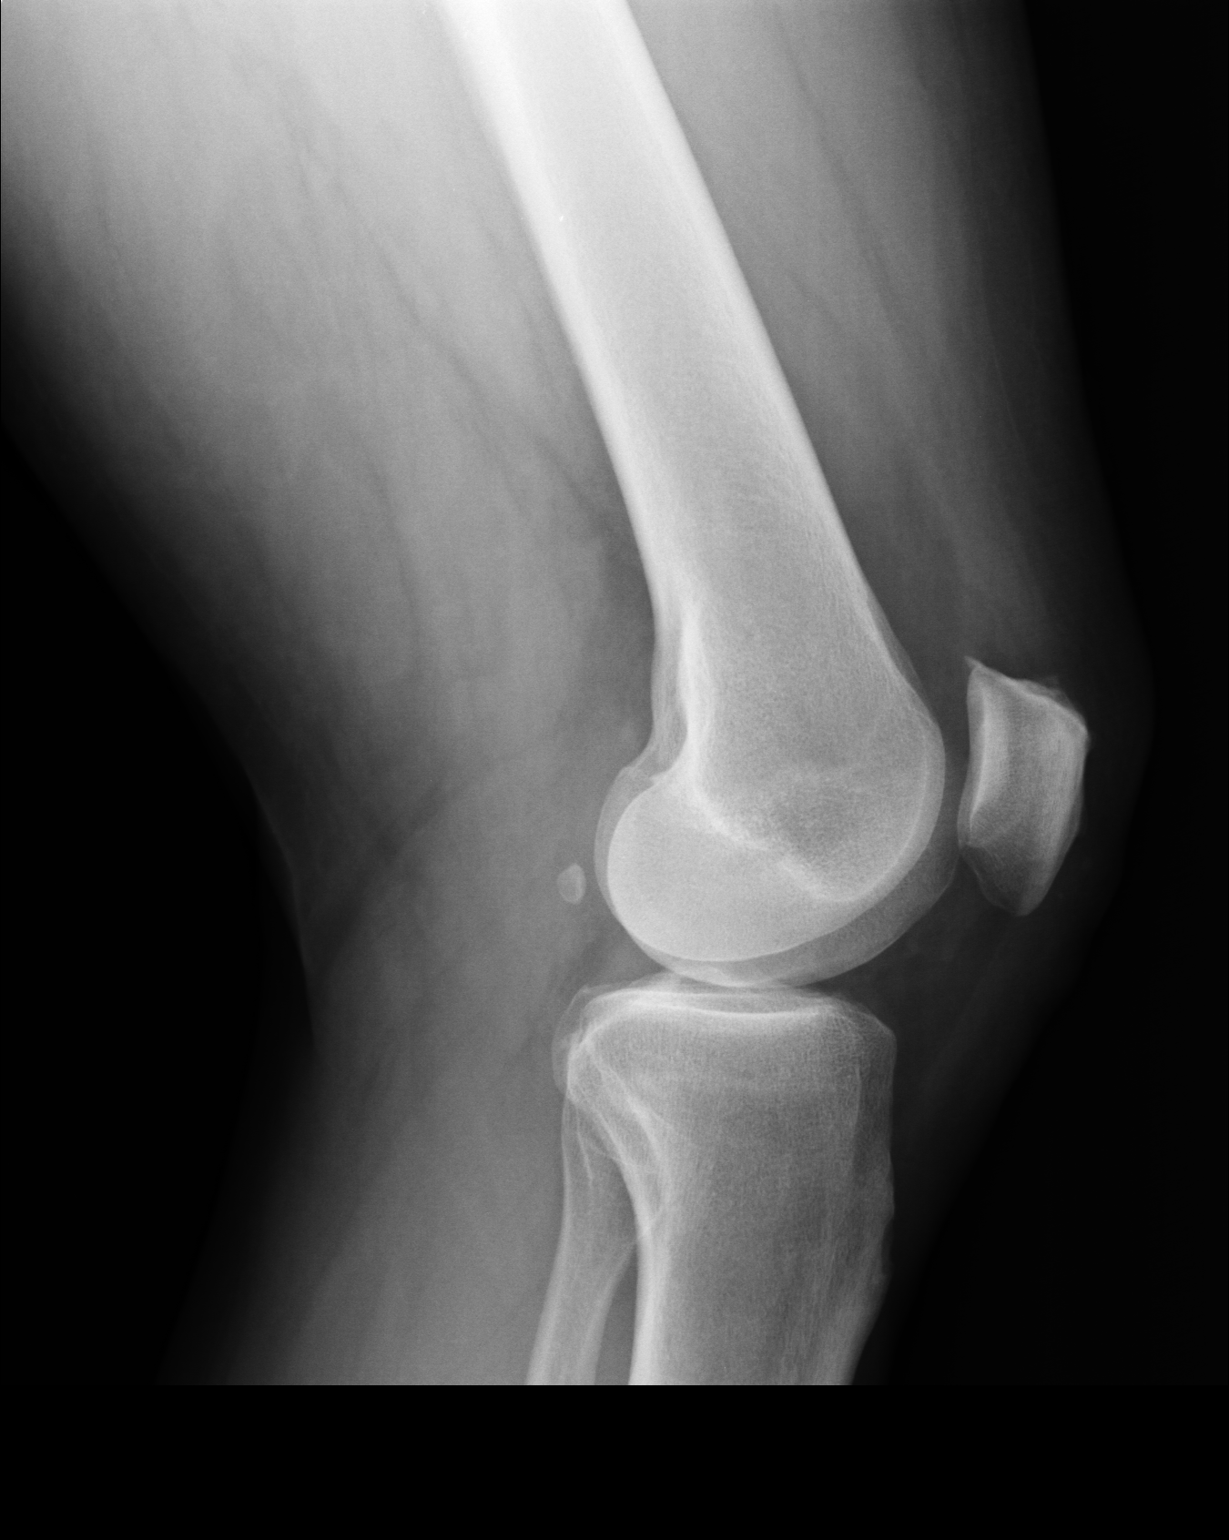

[3 of 3 positions shown; findings below may reference images not displayed]

FINDINGS: There is no acute fracture or subluxation. Tibiofemoral articulations are well maintained. There is mild bilateral patellofemoral joint space narrowing. There is no suprapatellar effusion on either side. There is no soft tissue abnormality.
IMPRESSION: Mild bilateral patellofemoral joint space narrowing.

## 2020-10-29 IMAGING — CR SCOLIOSIS EVAL 1 VIEW
1 series · 2 of 2 positions shown · non-contrast
Comparison: None available.

﻿EXAM:  [id number]      XRAY THORACIC SPINE THORACOLUMBAR JUNCTION 2 VIEWS
INDICATION: Back pain.

[Series 1: view not recorded · 0.17mm/px · 2 of 2 slices shown]
[im 1/2]
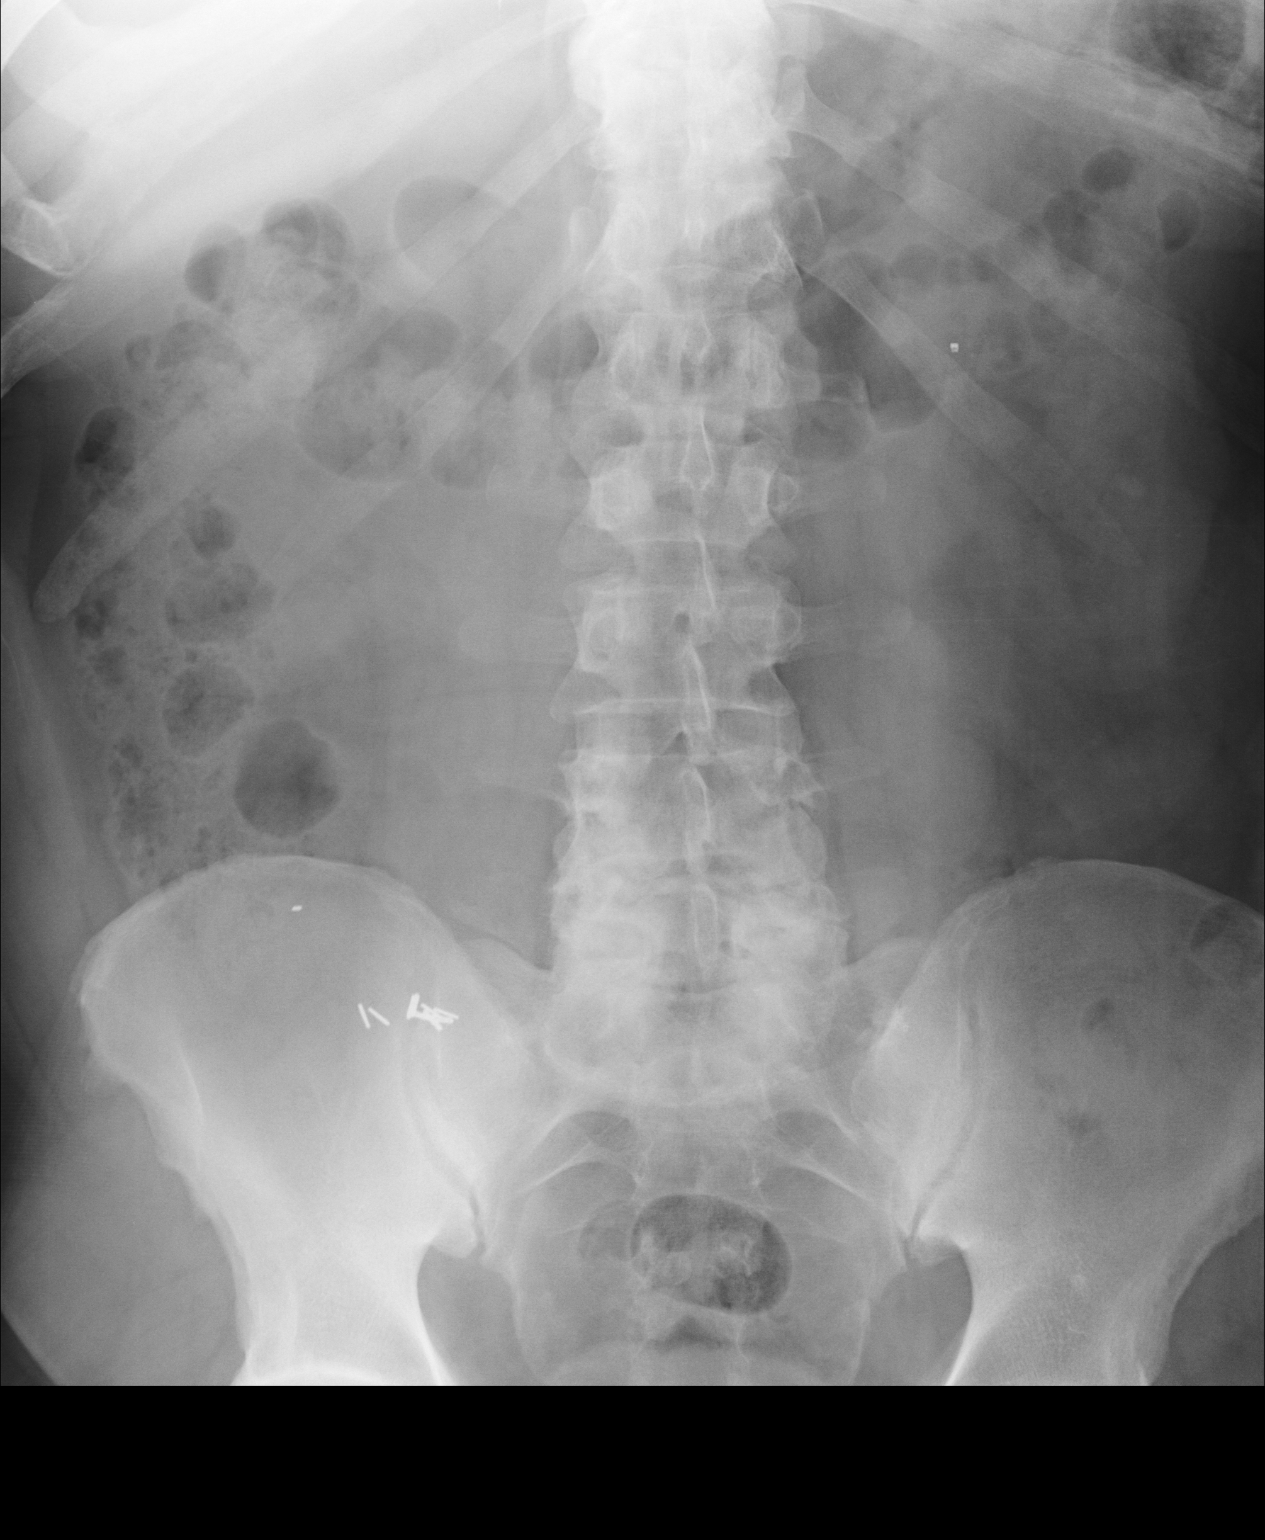
[im 2/2]
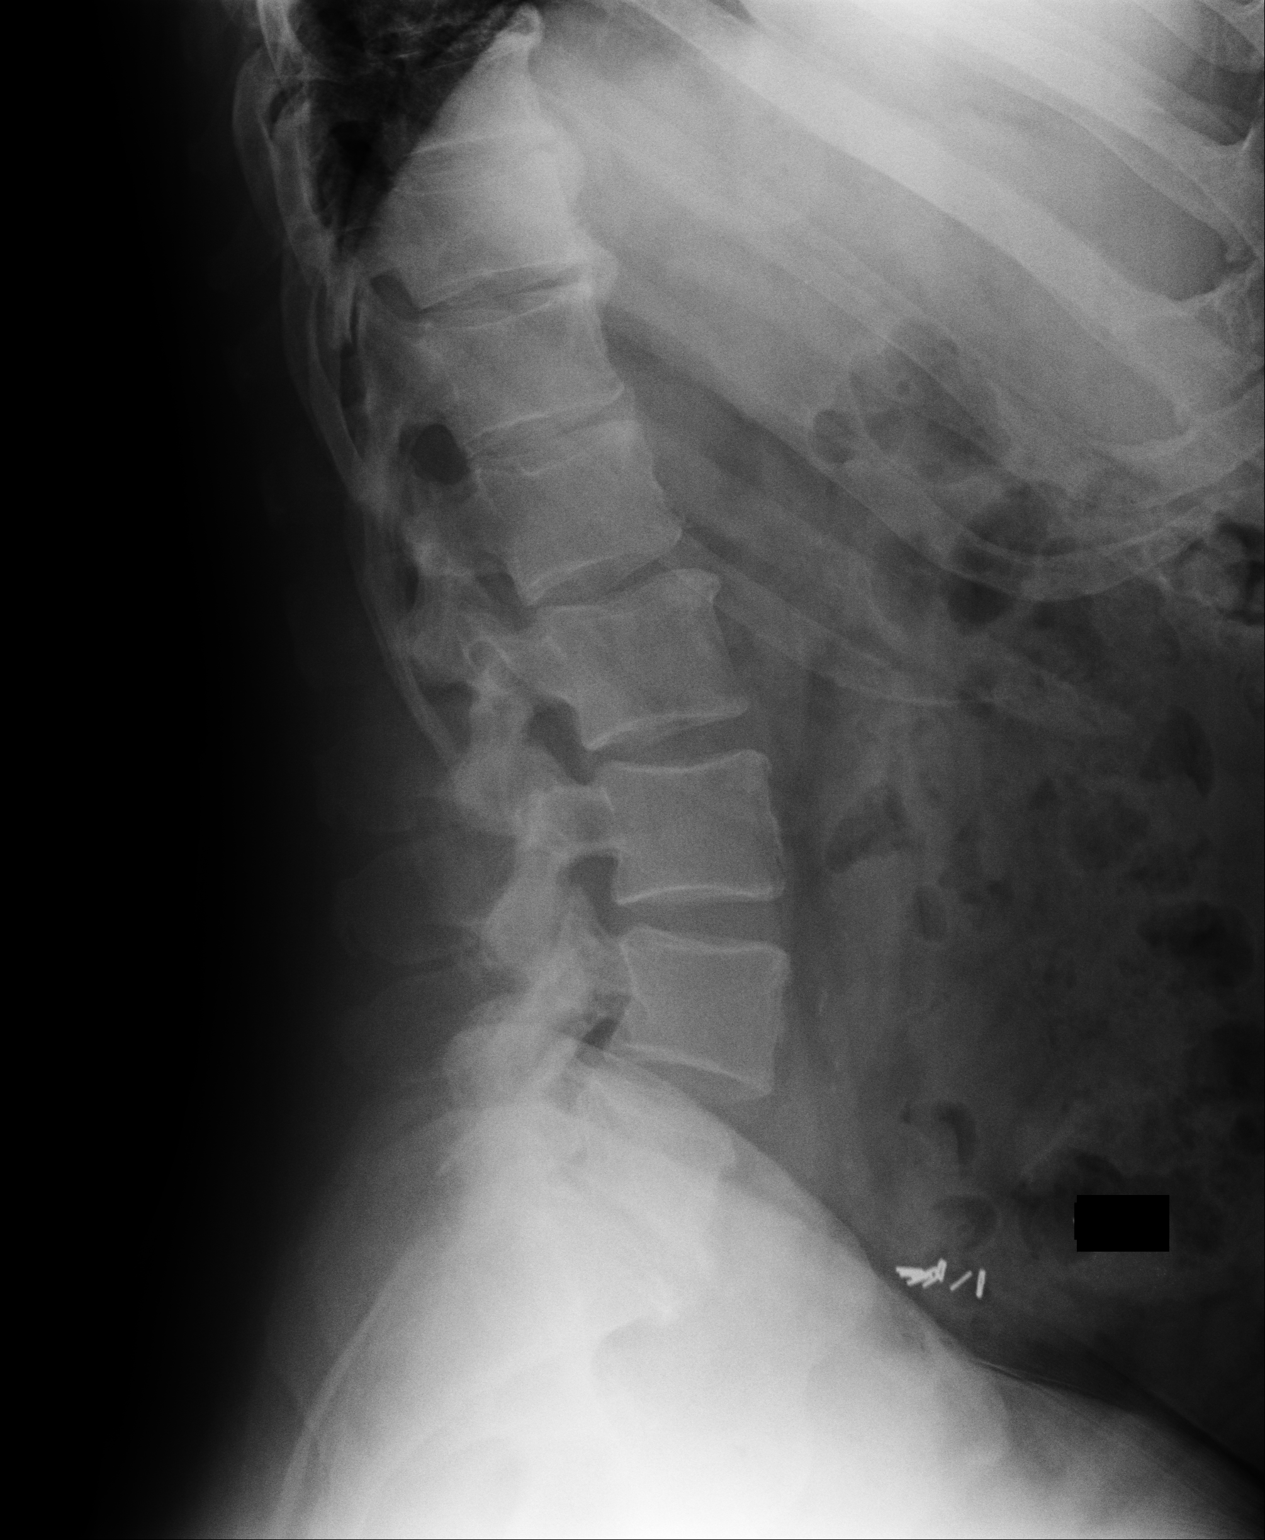

[2 of 2 positions shown; findings below may reference images not displayed]

FINDINGS: There is no acute fracture or subluxation. Mild degenerative disc disease is seen at a few levels at the thoracolumbar junction. Paraspinal soft tissues are unremarkable.
IMPRESSION: Mild multilevel degenerative changes, no acute osseous abnormality.

## 2021-06-02 ENCOUNTER — Other Ambulatory Visit (HOSPITAL_COMMUNITY): Payer: Self-pay | Admitting: Orthopaedic Surgery

## 2021-06-02 DIAGNOSIS — M48061 Spinal stenosis, lumbar region without neurogenic claudication: Secondary | ICD-10-CM

## 2021-06-02 DIAGNOSIS — M4317 Spondylolisthesis, lumbosacral region: Secondary | ICD-10-CM

## 2021-06-04 ENCOUNTER — Ambulatory Visit (HOSPITAL_COMMUNITY)
Admission: RE | Admit: 2021-06-04 | Discharge: 2021-06-04 | Disposition: A | Discharge status: Still In Hospital | Payer: Worker's Comp, Other unspecified | Source: Ambulatory Visit | Attending: Orthopaedic Surgery | Admitting: Orthopaedic Surgery

## 2021-06-04 ENCOUNTER — Other Ambulatory Visit: Payer: Self-pay

## 2021-06-04 DIAGNOSIS — M4317 Spondylolisthesis, lumbosacral region: Secondary | ICD-10-CM

## 2021-06-04 DIAGNOSIS — M48061 Spinal stenosis, lumbar region without neurogenic claudication: Secondary | ICD-10-CM | POA: Insufficient documentation

## 2021-06-04 NOTE — PT Evaluation (Signed)
[facility]  Outpatient Physical Therapy  [address]  [place], [id number]  [phone](Office) [phone]  (Fax) [phone]      Physical Therapy Lumbar Evaluation    Date: 06/04/2021  Patient's Name: Michael Glenn  Date of Birth: 24-Jun-1960    PT diagnosis/Reason for Referral: LBP              SUBJECTIVE  Date of onset: 08/05/2020    Mechanism of injury: pt tripped over a D ring in truck bed, falling from a height of 50 inches, and  landed on his head and  left sided . He reports + LOC at the time. . He busted up both knees ( ACL tears bilat) and fractured several ribs. He had a cervical fusion 03/19/2021.   Neck surgery helped UE pain. His insurance co. Is requiring steriod epidural before it will authorized a back surgery. The pt states the MD wants the pt to have preop back rehab to maximize his potential to benefit from surgery. Pt has cc of low back pressure that builds to point of his legs going weak.     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   PLOF; pt was working full time and had an active lifestyle    Next MD visit: 06/27/21    Pain location: LOW BACK, SACRAL, ISCHIAL, POSTERIOR LEGS, LEFT WORSE THAN RIGHT                    Pain description: DULL, ACHING, SHOOTING and PRESSURE ; LEGS HAVE A DEEP  BONE PAIN AND PINS AND NEEDLES. He HAD PLANTAR FOOT NUMBNESS    Pain frequency:  CONTINUOUS, VARIABLE, UNPREDICATABLE    Pain rating: Now 6   Best 4   Worst 9 (FROM BENDING AND TWISTING)    Radiculopathy: S1, L4    FOR PAST YEAR , NOT CHANGING    Pain increases with: ACTIVITY, WALK, STAIR CLIMBING and BENDING           decreases with : REST and SUPINE LYING,  LEFT SIDE LYING    Sensation: INTACT SENSATION TO LT ALL DERMATOMS    Weakness: PT FEELS WEAK FROM HIP DOWN, LEFT SIDE WEAKER    Sleep affected: NO WITHOUT MEDICATION    Bowel/bladder problems:NONE    Subjective Functional Reports:    Sitting: 15    Standing: 5    Walking: pt estimates 100 yards    Lifting: pt is limited to 3 lb lifting restriction per MD order.  Personal ADLs: painful , modified  HOUSEHOLD/YARD ADLS: limited           OBJECTIVE      AROM   right left   Flexion 45    Extension 5 with painfree ROM    Sidebend 10 5   Rotation nt nt     ROM comments painful end feel in all planes except extension.     Strength: PT STRUGGLES WITH SLS HEEL RAISE  BILAT, LEFT WEAKER THAN RIGHT ; PT STRUGGLES WITH A LEFT TOE TAP( DF); PT NOTES MILD WEAKNESS WITH  RLE SLS KNEE BEND; LLE SLS KNEE  BEND IS PAIN INHIBITED BY KNEE PAIN . PT STRUGGLES WITH HEEL WALK BUT CAN PERFORM A TOE WALK.   TRANSFERS; sit to stand to sit with UE counterbalance and uneven LE push off;   BED MOBILITY: labored, guarded and painful. Pt has to log roll off table.     Strength comments : PT NOTES MAX EFFORT WITH  R SUPINE SLR WITH LBP AND QUAD WEAKNESS.  PT PERFORMS L SLR WITH LESS LUMBAR PAIN AND NORMAL EFFORT ; PT IS UNABLE TO DO BRIDGING IN SUPINE DUE TO SEVERE LBP    Palpation: TENDER LUMBAR PARASPINALS.     Joint mobility GLOBAL STIFFNESS NOTED  OF LUMBAR SPINE IN ALL PLANES; ABSENT CURVE REVERSAL NOTED DURING STANDING FLEXION    Supine SLR   R = 30 degrees        L = 35 degrees     Posture: DECREASED LORDOSIS and level pelvic base, flat back; the pt sits and stands with frequent wt shifting to alleviate back pain. He does not tolerate unsupported sitting without use of UE support to unload back pressure.     Gait: NO ASSISTIVE DEVICE, RECIPROCATING STEPS WITH SYMMETRIC STRIDE LENGTH, NORMAL BOS and INITIATION OF GAIT WITH HESITATION    Reflexes    Patellar NORMAL R; DECREASED LEFT   Achilles ABSENT BILAT     Special tests:  deferred      Treatment provided:evaluation          ASSESSMENT    Impression: the pt presents with postural abnormalities, major loss of spinal ROM and altered body mechanics with all mobility. 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 He currently has non functional tolerances of sustained wb activity.     Rehab potential: GUARDED      Short Term Goals: 3 Weeks   -Intermittent vs. constant pain. Worst SPS rating less than 9.   -Reduce radicular symptoms  -Patient will tolerate sitting / standing with even weight distribution without increasing back pain.   -NWB spinal mobility and strength WFL to allow less labored  bed mobility.  -Increase FWB AROM by 10 degrees in frontal / sagittal planes of motion.   -Other: NWB core strengthening without a worsening of symptoms      Long Term Goals: 6  Weeks   -reduce or abolish  radicular symptoms. Worst SPS rating less than 6.   -ROM / Strength WFL to permit unlabored  body mechanics with  Bed mobility and transfers.  -ROM / Strength WFL to permit  personal ADL's without Compensatory mechanics due to pain or weakness.   -increase tolerance to sustained ambulation to 200 yards or more; standing 10 min or more   Optimize strength and flexibility prior to lumbar sugery.   PLAN  Patient will attend 2 times per week x 6 weeks. Therapy may include, but is not limited to THERAPEUTIC EXERCISES, MYOFASCIAL/JOINT MOBILIZATION, POSTURE/BODY MECHANICS, ERGONOMIC TRAINING, TRANSFER/GAIT TRAINING and HOME INSTRUCTIONS    Plan for next visit : check hip flexibility and address ROM deficits; NWB low level core strengthening, NWB gluteal stretches       Evaluation complexity:   Personal factors impacting POC: FREQUENT OR CHRONIC PAIN   Co-morbidities impacting POC: PRE-EXISTING NEUROLOGICAL DEFICITS and PREVIOUS SURGERIES  Complexity of physical exam: INCLUDING MUSCULOSKELETAL SYSTEM (POSTURE, ROM, STRENGTH, HEIGHT/WEIGHT), INCLUDING NEUROMUSCULAR  EXAM (BALANCE, GAIT, LOCOMOTION, MOBILITY), INCLUDING ACTIVITY/MOBILITY RESTRICTIONS and REFERRAL IS FOR A CHRONIC PROBLEM   Clinical Presentation: STABLE  Evaluation Complexity: HIGH-HISTORY 3+, EXAMINATION 4+, PRESENTATION EVOLVING/UNSTABLE        Total Session Time 45 and Timed code minutes 0       Intervention minutes: EVALUATION 45 minutes    Lunette Stands, PT  06/04/2021, 10:02              Start of Service: _________          Certification:    From:______  Through:_________    I certify the need for these services furnished under this plan of treatment and while under my care.    Referring Provider Signature: _______________     Date : _____________________

## 2021-06-09 ENCOUNTER — Other Ambulatory Visit: Payer: Self-pay

## 2021-06-09 ENCOUNTER — Ambulatory Visit (HOSPITAL_COMMUNITY)
Admission: RE | Admit: 2021-06-09 | Discharge: 2021-06-09 | Disposition: A | Discharge status: Still In Hospital | Payer: Worker's Comp, Other unspecified | Source: Ambulatory Visit | Attending: Orthopaedic Surgery | Admitting: Orthopaedic Surgery

## 2021-06-09 NOTE — PT Treatment (Signed)
[facility]  Outpatient Physical Therapy  [address]  [place], [address]  [phone]  (Fax) [phone]    Physical Therapy Treatment Note    Date: 06/09/2021  Patient's Name: Michael Glenn  Date of Birth: 02/10/1960            Visit #/POC: 2 / 12  Authorization: [id number]      Evaluating Physical Therapist: Lunette Stands PT  PT diagnosis/Reason for Referral: low back pian   Next Scheduled Physician Appointment:  06/27/21          Subjective: Doing about the same.  No changes since eval last week.  Continues to have difficulty with extended postures in sitting or standing as well as walking long periods.      Objective: Activities as noted below for gentle low back and hip mobility and soft tissue release. Handouts for home program as noted below      EXERCISE/ACTIVITY NAME REPETITIONS RESISTANCE COMPLETED THIS DOS   Lower trunk rotation on ball   20  na Yes; added to HEP    double knee to chest on ball   20 na Yes; added to HEP   Single knee to chest on ball/ opposite leg straight   10 each na Yes; added to HEP   R psoas release   na minimal yes   Bilateral hip PROM ER/IR   na na yes                                     Access Code: [email or web address]  URL: [email or web address]  Date: 06/09/2021  Prepared by: Hulan Saas Damir Leung    Exercises  - Supine Lower Trunk Rotation with Swiss Ball  - 1 x daily - 7 x weekly - 3 sets - 10 reps  - Supine Hip and Knee Flexion AROM with Swiss Ball  - 1 x daily - 7 x weekly - 3 sets - 10 reps  - Supine Single Knee to Chest  - 1 x daily - 7 x weekly - 3 sets - 10 reps    Assessment: Pt tolerated exercise well and noted relief.  R psoas extremely tender and tight.  Minimal pressure causes reflexive guarding and pain. Bilateral hip with significantly limited IR   R more than L. Pt may benefit from R hip joint mobilization as indirect stretch to SI      Plan: Will monitor effects of exercise and soft tissue release.  Proceed accordingly.  If appropriate may  want to try R hip mobilization for indirect mobility of SI    Short Term Goals: 3 Weeks              -Intermittent vs. constant pain. Worst SPS rating less than 9.              -Reduce radicular symptoms  -Patient will tolerate sitting / standing with even weight distribution without increasing back pain.              -NWB spinal mobility and strength WFL to allow less labored  bed mobility.  -Increase FWB AROM by 10 degrees in frontal / sagittal planes of motion.              -Other: NWB core strengthening without a worsening of symptoms      Long Term Goals: 6  Weeks              -  reduce or abolish  radicular symptoms. Worst SPS rating less than 6.              -ROM / Strength WFL to permit unlabored  body mechanics with  Bed mobility and transfers.  -ROM / Strength WFL to permit  personal ADL's without Compensatory mechanics due to pain or weakness.              -increase tolerance to sustained ambulation to 200 yards or more; standing 10 min or more              Optimize strength and flexibility prior to lumbar sugery.       Total Session Time 35 and Timed code minutes 35  THERAPEUTIC EXERCISE 35 minutes      Carri Spillers, PTA  06/09/2021, 10:20

## 2021-06-11 ENCOUNTER — Other Ambulatory Visit: Payer: Self-pay

## 2021-06-11 ENCOUNTER — Ambulatory Visit (HOSPITAL_COMMUNITY)
Admission: RE | Admit: 2021-06-11 | Discharge: 2021-06-11 | Disposition: A | Discharge status: Still In Hospital | Payer: Worker's Comp, Other unspecified | Source: Ambulatory Visit | Attending: Orthopaedic Surgery | Admitting: Orthopaedic Surgery

## 2021-06-11 NOTE — PT Treatment (Signed)
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 Pt's pain is intermittent vs constant. He has not yet obtained a physioball but will today.     Objective:  The pt's standing arom was observed prior to treatment f/b manual therapies f/b standing and seated active mobility  EXERCISE/ACTIVITY NAME REPETITIONS RESISTANCE COMPLETED THIS DOS   Lower trunk rotation on ball   20  na n0; added to HEP    double knee to chest on ball   20 na No added to HEP   Single knee to chest on ball/ opposite leg straight   10 each na no; added to HEP   R psoas release   na minimal no   Bilateral hip PROM ER/IR   na na no     physioball  Lumbar seated side glide R/L        thoracic mobilization SB R, ROT L SIDE LYING R,  GRADE II AND III YES, ADDED TO HEP     SIDE LYING ACTIVE THORACIC ROTATION    YES, ADDED TO HEP     SIDE LYING R QUADRATUS LUMBORUM STRETCH NA TO END RANGE YES, ADDED TO HEP   WRITTEN AND PICTORIAL INSTRUCTIONS WERE ISSUED. SEE HANDOUTS IN CHART.    Exercises  - Supine Lower Trunk Rotation with Swiss Ball  - 1 x daily - 7 x weekly - 3 sets - 10 reps  - Supine Hip and Knee Flexion AROM with Swiss Ball  - 1 x daily - 7 x weekly - 3 sets - 10 reps  - Supine Single Knee to Chest  - 1 x daily - 7 x weekly - 3 sets - 10 reps    Assessment: THE PT'S GROSS AND SEGMENTAL SPINAL MOBILITY WAS IMPROVED AFTER MANUAL THERAPIES. He WAS ABLE TO PERFORM SEATED AND SIDE LYING ACTIVE MOBILITY EXERCISES. He HAS DIFFICULTY  WITH TRANSITIONS.       Short Term Goals:3Weeks  -Intermittent vs. constant pain. Worst SPS rating less than 9. ( MET IN PART 6 / 8/23)  -Reduce radicular symptoms  -Patient will tolerate sitting / standing with even weight distribution without increasing back pain.  -NWB spinal mobility and strength WFL to allow less laboredbed mobility.  -Increase FWB AROM by10degrees in frontal / sagittal planes of motion.  -Other: NWB core strengthening without a worsening of symptoms      Long Term Goals:6Weeks  -reduce or abolishradicular symptoms. Worst SPS rating less than 6.  -ROM / Strength WFL to permit unlaboredbody mechanics with Bedmobilityand transfers.  -ROM / Strength WFL to permit personal ADL's without Compensatory mechanics due to pain or weakness.  -increase tolerance to sustained ambulation to 200 yards or more; standing 10 min or more  Optimize strength and flexibility prior to lumbar s      Plan: CONTINUE WITH NWB MOBILITY AND PROGRESSION OF HEP.     NOTE: THIS PT WAS SIGNED IN LATE  ON COMPUTER . His TREATMENT STARTED AT 9:17 AND ENDED 10;04  Total Session Time 45 and Timed code minutes 45  THERAPEUTIC EXERCISE 45 minutes      Leanor Rubenstein, PT  06/11/2021, 10:04

## 2021-06-16 ENCOUNTER — Ambulatory Visit (HOSPITAL_COMMUNITY)
Admission: RE | Admit: 2021-06-16 | Discharge: 2021-06-16 | Disposition: A | Discharge status: Still In Hospital | Payer: Worker's Comp, Other unspecified | Source: Ambulatory Visit | Attending: Orthopaedic Surgery | Admitting: Orthopaedic Surgery

## 2021-06-16 ENCOUNTER — Other Ambulatory Visit: Payer: Self-pay

## 2021-06-16 NOTE — PT Treatment (Signed)
[facility]  Outpatient Physical Therapy  [place], [address]  [phone]  [phone]    Physical Therapy Treatment Note    Date: 06/16/2021  Patient's Name: Michael Glenn  Date of Birth: 08-08-1960            Visit #/POC: 4/12 ; 07/16/21  Authorization:12      Evaluating Physical Therapist:  Leanor Rubenstein PT  PT diagnosis/Reason for Referral: Lumbosacral sprain, stenosis,   spondylolithesis L4/5  Next Scheduled Physician Appointment:  06/27/21          Subjective:  Pt reports his back is feeling good.  Rates pain in low back is minimal.  States he did have "pain shot" last week.  States tomorrow he will get another tomorrow along with steroid shot.  Notes he is doing exercise at home as advised.  Neck and R knee are more painful today.  States he feels like he is chocking at times.  Pt is advised to call his surgeon for neck concerns.  Pt does note he has increased walking distance since his back is feeling better.      Objective: activities as noted below.  Worked on extension to neutral for strengthening.  Hip stretches as noted below.  Did advised to use towel roll for neck since he does not have Buckwheat pillow    EXERCISE/ACTIVITY NAME REPETITIONS RESISTANCE COMPLETED THIS DOS   Lower trunk rotation on ball   20  na n0; added to HEP   double knee to chest on ball   20 na No added to HEP   Single knee to chest on ball/ opposite leg straight   10 each na no; added to HEP   R psoas release   na minimal no   Bilateral hip PROM ER/IR   na na no     physioball  Lumbar seated side glide R/L    yes     thoracic mobilization SB R, ROT L SIDE LYING R,  GRADE II AND III  no, ADDED TO HEP    seated trunk extension to neutral   15  with physioball  yes    wall slide (short range)  10   yes    trunk extension machine to neutral  3x10  20#  yes    seated hip rotation stretch (mod on R)    yes     SIDE LYING ACTIVE THORACIC ROTATION     no,   ADDED TO HEP          hip isometric x 4 way    yes           SIDE LYING R QUADRATUS LUMBORUM STRETCH NA TO END RANGE YES, ADDED TO HEP             Assessment: Pt tolerated fair.  He does need cues to keep exercise in pain free ranges and to slow down pace.  Pt has flare up of neck pain and is calling surgeon today.      Plan: Will monitor and proceed accordingly    Short Term Goals:3Weeks  -Intermittent vs. constant pain. Worst SPS rating less than 9. ( MET IN PART 6 / 8/23)  -Reduce radicular symptoms  -Patient will tolerate sitting / standing with even weight distribution without increasing back pain.  -NWB spinal mobility and strength WFL to allow less laboredbed mobility.  -Increase FWB AROM by10degrees in frontal / sagittal planes of motion.  -  Other: NWB core strengthening without a worsening of symptoms      Long Term Goals:6Weeks  -reduce or abolishradicular symptoms. Worst SPS rating less than 6.  -ROM / Strength WFL to permit unlaboredbody mechanics with Bedmobilityand transfers.  -ROM / Strength WFL to permit personal ADL's without Compensatory mechanics due to pain or weakness.  -increase tolerance to sustained ambulation to 200 yards or more; standing 10 min or more  Optimize strength and flexibility prior to lumbar s      Total Session Time 35 and Timed code minutes 35  THERAPEUTIC EXERCISE 35 minutes      Leisha Truitt, PTA  06/16/2021, 08:44

## 2021-06-18 ENCOUNTER — Other Ambulatory Visit: Payer: Self-pay

## 2021-06-18 ENCOUNTER — Ambulatory Visit (HOSPITAL_COMMUNITY)
Admission: RE | Admit: 2021-06-18 | Discharge: 2021-06-18 | Disposition: A | Discharge status: Still In Hospital | Payer: Worker's Comp, Other unspecified | Source: Ambulatory Visit | Attending: Orthopaedic Surgery | Admitting: Orthopaedic Surgery

## 2021-06-18 NOTE — PT Treatment (Signed)
[facility]  Outpatient Physical Therapy  [place], [address]  [phone]  [phone]    Physical Therapy Treatment Note    Date: 06/18/2021  Patient's Name: Michael Glenn  Date of Birth: 04-25-1960      Visit #/POC: 4/12 ; 07/16/21  Authorization:12      Evaluating Physical Therapist:  Leanor Rubenstein PT  PT diagnosis/Reason for Referral: Lumbosacral sprain, stenosis,   spondylolithesis L4/5  Next Scheduled Physician Appointment:  06/27/21          Subjective: he feels looser and can move better. He has been walking more every day up to 6/10 mile before taking a seated rest. He has had more right thigh and groin pain as well as pain in right lateral hip and buttock. His walks have been on uneven terrain. His right knee feels unstable after an ACL tear and he has to walk in a certain way to keep his right knee stable under him. He owns a cane but is not using. He is doing HEP .  EXERCISE/ACTIVITY NAME REPETITIONS RESISTANCE COMPLETED THIS DOS   Lower trunk rotation on ball   20  na n0; added to HEP   double knee to chest on ball   20 na Noadded to HEP   Single knee to chest on ball/ opposite leg straight   10 each na no; added to HEP   R psoas release   na minimal YES   Bilateral hip PROM ER/IR   na na YES, RIGHT     physioballLumbarseated side glide R/L    yes     thoracic mobilization SB R, ROT L SIDE LYING R, GRADE II AND III  no, ADDED TO HEP    seated trunk extension to neutral   15  with physioball  NO    wall slide (short range)  10   NO    trunk extension machine to neutral  3x10  20#  NO    seated hip rotation stretch (mod on R)    YES     SIDE LYING ACTIVE THORACIC ROTATION    no,  ADDED TO HEP   GT WITH SPC    YES    hip isometric x 4 way    NO   R HIP JOINT MOBILIZATION, MULTIDIRECTIONAL, MULTIPLANE  MANUAL YES     SIDE LYING R QUADRATUS LUMBORUM STRETCH NA TO END RANGE NO, ADDED TO HEP        THE PT WAS ADVISED TO USE A SPC AND His R KNEE BRACE TO NORMALIZE His GAIT MECHANICS. THE PT WAS INSTRUCTED ON CORRECTED SITTING POSTURE USING A WEDGE SEAT , LUMBAR SUPPORT AND FURNITURE RISERS. He WAS ADVISED TO AMBULATE ON LEVEL SURFACES ONLY.       Assessment: THE PT HAS ALTERED GAIT MECHANICS DUE TO R KNEE INSTABILITY. 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 THE PT'S GAIT MECHANICS WERE MUCH IMPROVED WITH USE OF A SPC. He IS EXQUISITELY TENDER ON R ASIS AND R HIP FLEXOR. He TOLERATED A MANUAL ILIOPSOAS RELEASE POORLY BUT DID HAVE IMPROVE R HIP ROM POST SESSION.   Short Term Goals: 3 Weeks ( NOT MET)              -Intermittent vs. constant pain. Worst SPS rating less than 9.              -Reduce radicular symptoms  -  Patient will tolerate sitting / standing with even weight distribution without increasing back pain.              -NWB spinal mobility and strength WFL to allow less labored  bed mobility.  -Increase FWB AROM by 10 degrees in frontal / sagittal planes of motion.              -Other: NWB core strengthening without a worsening of symptoms      Long Term Goals: 6  Weeks              -reduce or abolish  radicular symptoms. Worst SPS rating less than 6.              -ROM / Strength WFL to permit unlabored  body mechanics with  Bed mobility and transfers.  -ROM / Strength WFL to permit  personal ADL's without Compensatory mechanics due to pain or weakness.              -increase tolerance to sustained ambulation to 200 yards or more; standing 10 min or more              Optimize strength and flexibility prior to lumbar sugery.         Plan: ENCOURAGE USE OF SPC AND KNEE BRACE. CONTINUE WITH NWB MOBILITY EXERCISES.     Total Session Time 45 and Timed code minutes 45  JOINT MOBILIZATION/MFR 30 minutes and THERAPEUTIC ACTIVITY 10 minutes      Leanor Rubenstein, PT  06/18/2021, 12:15

## 2021-06-19 ENCOUNTER — Ambulatory Visit (HOSPITAL_COMMUNITY): Payer: Self-pay

## 2021-06-23 ENCOUNTER — Ambulatory Visit (HOSPITAL_COMMUNITY)
Admission: RE | Admit: 2021-06-23 | Discharge: 2021-06-23 | Disposition: A | Discharge status: Still In Hospital | Payer: Worker's Comp, Other unspecified | Source: Ambulatory Visit | Attending: Orthopaedic Surgery | Admitting: Orthopaedic Surgery

## 2021-06-23 ENCOUNTER — Other Ambulatory Visit: Payer: Self-pay

## 2021-06-23 NOTE — PT Treatment (Signed)
[facility]  Outpatient Physical Therapy  [place], [address]  [phone]  [phone]    Physical Therapy Treatment Note    Date: 06/23/2021  Patient's Name: Sael Furches Bangura  Date of Birth: Jun 13, 1960            Visit #/POC: 6/ 12; 07/16/21  Authorization: 12      Evaluating Physical Therapist: Leanor Rubenstein PT  PT diagnosis/Reason for Referral:  Lumbosacral sprain; stenosis  Next Scheduled Physician Appointment:  06/24/21          Subjective:  Pt states he has been hurting since last visit.  Notes he felt good when left session but then pain returned.  Pt states he had pain shots the day before his last session.  Notes he is very sore.  Pt also acknowledges that he is better overall since starting therapy.  He is walking more and moving better.  Is now using cane to walk long distance and is using knee brace on R.  Reports 35-40% better overall.  Worst pain 8/10 in past week.  States 0/10 pain sometimes at rest.    Objective: Activities noted below      EXERCISE/ACTIVITY NAME REPETITIONS RESISTANCE COMPLETED THIS DOS   Lower trunk rotation on ball   20  na n0; added to HEP   double knee to chest on ball   20 na Noadded to HEP   Single knee to chest on ball/ opposite leg straight   10 each na no; added to HEP   R psoas release   na minimal  no   Bilateral hip PROM ER/IR   na na YES, RIGHT     physioballLumbarseated side glide R/L    no     thoracic mobilization SB R, ROT L SIDE LYING R, GRADE II AND III  no, ADDED TO HEP   seated trunk extension to neutral  15 with physioball NO   wall slide (short range) 10  NO   trunk extension machine to neutral 3x10 20# NO   seated hip rotation stretch (mod on R)    YES     SIDE LYING ACTIVE THORACIC ROTATION    no,ADDED TO HEP   GT WITH SPC    no   hip isometric x 4 way    yes   R HIP JOINT MOBILIZATION, MULTIDIRECTIONAL, MULTIPLANE  MANUAL  no          Hooklying  march with TA set  20   yes    pelvic tilt  5    yes; stopped d/t pain                 SIDE LYING R QUADRATUS LUMBORUM STRETCH NA TO END RANGE NO, ADDED TO HEP           Assessment: Pt tolerated treatment fair.  Pt with several episodes of low back "catching" during exercise.  Pain behaviors with exercises noted today as well.     Plan: Will await recommendations from doctors visit and proceed accordingly    Short Term Goals:3Weeks ( NOT MET)  -Intermittent vs. constant pain. Worst SPS rating less than 9.  -Reduce radicular symptoms  -Patient will tolerate sitting / standing with even weight distribution without increasing back pain.  -NWB spinal mobility and strength WFL to allow less laboredbed mobility.  -Increase FWB AROM by10degrees in frontal / sagittal planes of motion.  -Other: NWB core strengthening without a worsening  of symptoms      Long Term Goals:6Weeks  -reduce or abolishradicular symptoms. Worst SPS rating less than 6.  -ROM / Strength WFL to permit unlaboredbody mechanics with Bedmobilityand transfers.  -ROM / Strength WFL to permit personal ADL's without Compensatory mechanics due to pain or weakness.  -increase tolerance to sustained ambulation to 200 yards or more; standing 10 min or more  Optimize strength and flexibility prior to lumbar sugery.       Total Session Time 30 and Timed code minutes 30  THERAPEUTIC EXERCISE 30 minutes      Leisha Truitt, PTA  06/23/2021, 08:55

## 2021-06-25 ENCOUNTER — Other Ambulatory Visit: Payer: Self-pay

## 2021-06-25 ENCOUNTER — Ambulatory Visit
Admission: RE | Admit: 2021-06-25 | Discharge: 2021-06-25 | Disposition: A | Discharge status: Still In Hospital | Payer: Worker's Comp, Other unspecified | Source: Ambulatory Visit | Attending: Orthopaedic Surgery | Admitting: Orthopaedic Surgery

## 2021-06-25 NOTE — PT Treatment (Addendum)
[facility]  Outpatient Physical Therapy  [place], [address]  [phone]  [phone]    Physical Therapy Treatment Note    Date: 06/25/2021  Patient's Name: Michael Glenn  Date of Birth: [birth year]    Visit #/POC:7 / 12  Authorization:7 / 12    Evaluating Physical Therapist: Leanor Rubenstein PT  PT diagnosis/Reason for Referral: LUMBOSACRAL SPRAIN, LUMBAR STENOSIS MULTILEVEL  Next Scheduled Physician Appointment: TBA    ADDENDUM 07/02/21: I SPOKE TO THE PATIENT TODAY. He STATES He WENT TO [facility] MD OFFICE AT AN UNSCHEDULED TIME AND WAS SEEN BY MD. THE PT STATES THE THE MD IS ORDERING MORE TESTS AND IS DISCONTINUING THERAPY AT THIS TIME. THE PT IS DISCHARGED AT THIS TIME. Leanor Rubenstein, PT        Subjective:  His back and legs gave out while he was at [facility]. He hit the floor had to be lifted off floor by [employer] staff.  His back gave out with no warning. He is sore across his low back this morning. He had a bad week due to a lot of car  Travel. He has adjusted his car seat as instructed by therapist. . He wears a brace all the time. He has had several occasions of losing control of his bladder. He will register for a steroid epidural  Next week  At [facility] . This is his 2nd epidural . The first one did not help. He gets Toradol shots weekly from PCP. His [facility] MD  Told him worker's compensation was requiring that he do rehab before surgery. His [facility] MD stated he absolutely needed a back surgery.    Objective:  AROM: 10 DEGREES SB R, 0 DEGREES SB LEFT ( THORACIC PAIN). PT WAS OBSERVED TO HAVE A THORACIC LIMITATION WITH SBR, ROT L  AS WELL AS A LUMBAR RESTRICTION. THE PT STANDS IN SLIGHTLY FLEXED POSTURE DUE TO INTOLERANCE OF ERECTSTANCE.     EXERCISE/ACTIVITY NAME REPETITIONS RESISTANCE COMPLETED THIS DOS   Lower trunk rotation on ball   20  na n0; added to HEP   double knee to chest on ball   20 na Noadded to HEP   Single knee to chest on ball/  opposite leg straight   10 each na no; added to HEP   R psoas release   na minimal  no   Bilateral hip PROM ER/IR   na na NO, RIGHT     physioballLumbarseated side glide R/L    no     thoracic mobilization SB R, ROT L SIDE LYING R, GRADE II AND III YES   seated trunk extension to neutral  15 with physioball NO   wall slide (short range) 10  NO   trunk extension machine to neutral 3x10 20# NO   seated hip rotation stretch (mod on R)   NO     SIDE LYING ACTIVE THORACIC ROTATION    no,ADDED TO HEP   GT WITH SPC   no   hip isometric x 4 way    NO   R HIP JOINT MOBILIZATION, MULTIDIRECTIONAL, MULTIPLANE  MANUAL  no          Hooklying march with TA set  20   NO    pelvic tilt  5    NO;                  SIDE LYING R QUADRATUS LUMBORUM STRETCH NA TO END RANGE NO,  ADDED TO HEP           Assessment: THE PT HAS A MAJOR LOSS OF MOTION IN ALL PLANES AND SIGNIFICANT POSTURAL ABNORMALITIES. He ALMOST LOST CONTROL OF His BLADDER WHILE TRANSITIONING OFF EXAM TABLE. He IS HAVING INCIDENTS OF His LEGS FAILING HIM AND FALLING TO THE FLOOR AND INCIDENCES OF LOSING BLADDER CONTROL. PHYSICAL THERAPY IMPRESSION IS THAT THE PT IS NOT A CANDIDATE FOR REHAB. Marland Kitchen His HIP ROM IS SLIGHTLY INCREASED SINCE SOC BUT His FUNCTION AND WB ROM  AND BODY MECHANICS ARE ALL SEVERELY COMPROMISED. He IS POORLY TOLERANT OF EVEN NWB ROM .     Short Term Goals:3Weeks( NOT MET)  -Intermittent vs. constant pain. Worst SPS rating less than 9.  -Reduce radicular symptoms  -Patient will tolerate sitting / standing with even weight distribution without increasing back pain.  -NWB spinal mobility and strength WFL to allow less laboredbed mobility.  -Increase FWB AROM by10degrees in frontal / sagittal planes of motion.  -Other: NWB core strengthening without a worsening of symptoms      Long Term Goals:6Weeks ( NOT  MET)  -reduce or abolishradicular symptoms. Worst SPS rating less than 6.  -ROM / Strength WFL to permit unlaboredbody mechanics with Bedmobilityand transfers.  -ROM / Strength WFL to permit personal ADL's without Compensatory mechanics due to pain or weakness.  -increase tolerance to sustained ambulation to 200 yards or more; standing 10 min or more  Optimize strength and flexibility prior to lumbar sugery.  Plan:    Will discuss POC with MD and pt's case worker before proceeding with  More therapy.     Total Session Time 30 and Timed code minutes 30  THERAPEUTIC EXERCISE 30 minutes      Leanor Rubenstein, PT  06/25/2021, [phone]

## 2021-06-30 ENCOUNTER — Ambulatory Visit (HOSPITAL_COMMUNITY): Payer: Self-pay

## 2021-07-02 ENCOUNTER — Ambulatory Visit (HOSPITAL_COMMUNITY): Payer: Self-pay

## 2021-12-10 ENCOUNTER — Encounter (INDEPENDENT_AMBULATORY_CARE_PROVIDER_SITE_OTHER): Payer: Self-pay

## 2022-05-04 IMAGING — MR MRI LUMBAR SPINE WITHOUT AND WITH CONTRAST
8 series · 48 of 48 positions shown · IV contrast (Gadavist)
Comparison: September 25, 2020

﻿EXAM:  11984   MRI LUMBAR SPINE WITHOUT AND WITH CONTRAST
INDICATION: Low back pain, bilateral lower extremity weakness.
TECHNIQUE: Multiplanar, multisequence MRI was performed both before and after the intravenous administration of 10 mL Gadavist.

[Series 5: T2 · sagittal · 4.5mm · 0.94mm/px · 5 of 13 slices shown (1 of 3)]
[im 1/13]
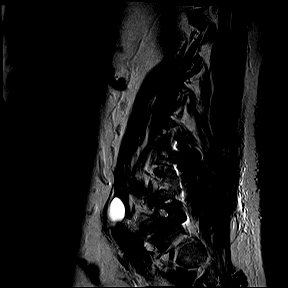
[im 4/13]
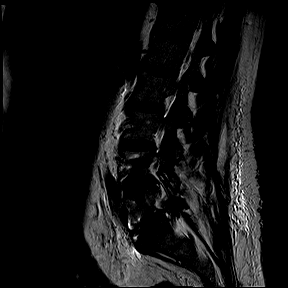
[im 7/13]
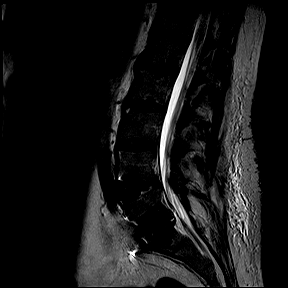
[im 10/13]
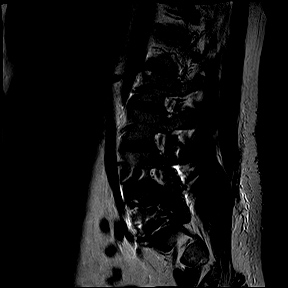
[im 13/13]
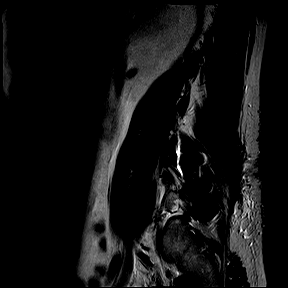

[Series 6: T1 · sagittal · 4.5mm · 0.94mm/px · 4 of 13 slices shown]
[im 1/13]
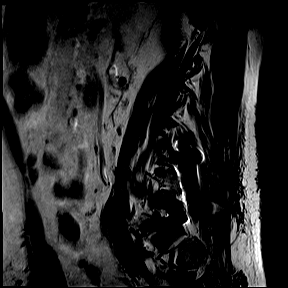
[im 5/13]
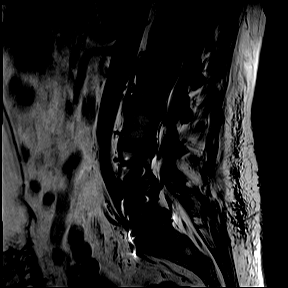
[im 9/13]
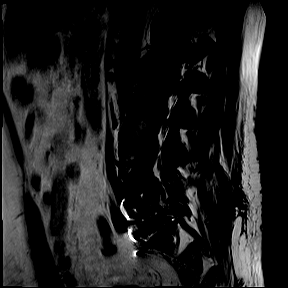
[im 13/13]
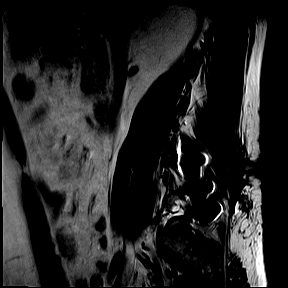

[Series 7: STIR · sagittal · 4.5mm · 1.05mm/px · 4 of 13 slices shown]
[im 1/13]
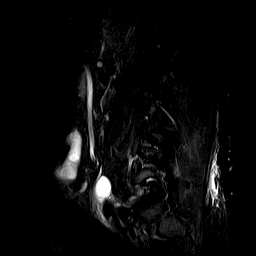
[im 5/13]
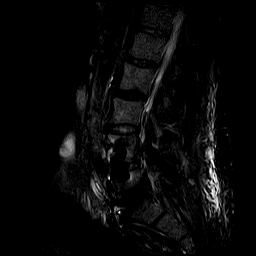
[im 9/13]
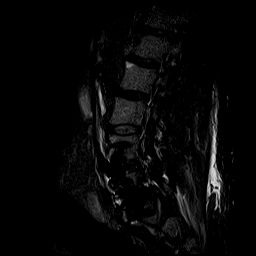
[im 13/13]
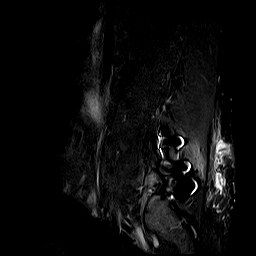

[Series 8: T2 · coronal · 4.0mm · 1.46mm/px · 7 of 20 slices shown (2 of 3)]
[im 1/20]
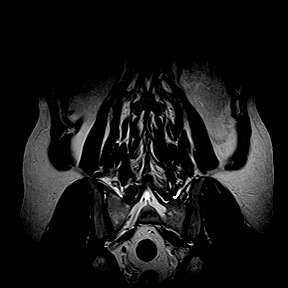
[im 4/20]
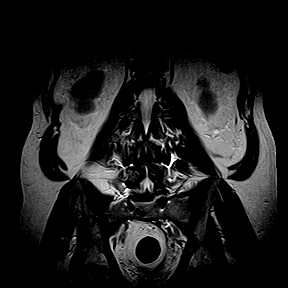
[im 7/20]
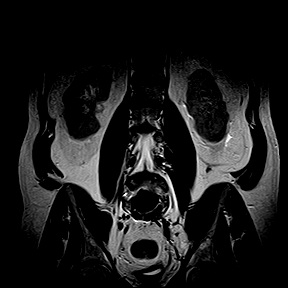
[im 10/20]
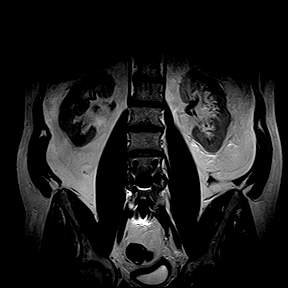
[im 13/20]
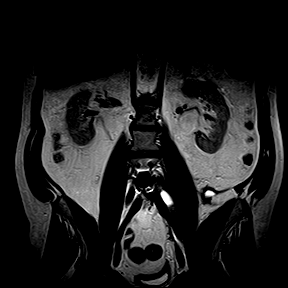
[im 16/20]
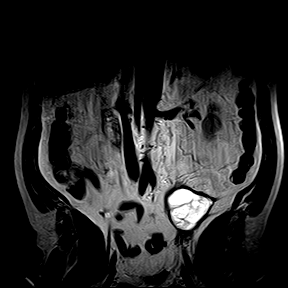
[im 20/20]
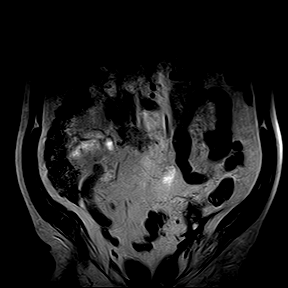

[Series 9: T2 · axial · 4.0mm · 0.47mm/px · z∈[-103,+114]mm · 8 of 23 slices shown (3 of 3)]
[im 1/23]
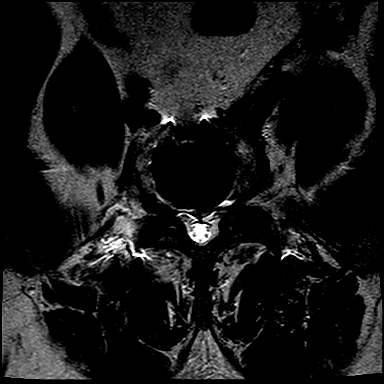
[im 4/23]
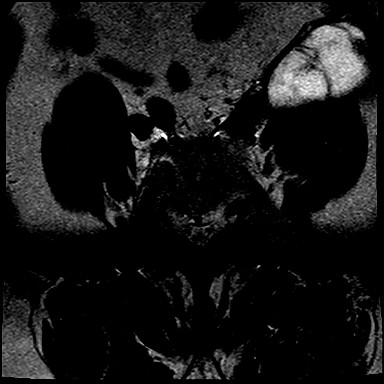
[im 7/23]
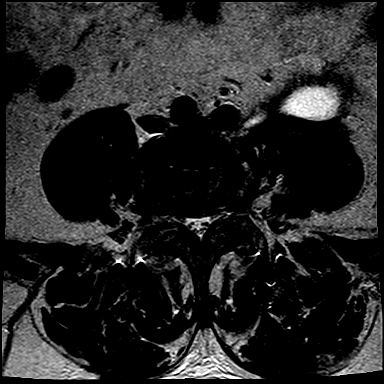
[im 10/23]
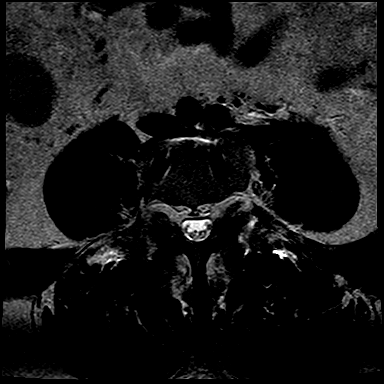
[im 13/23]
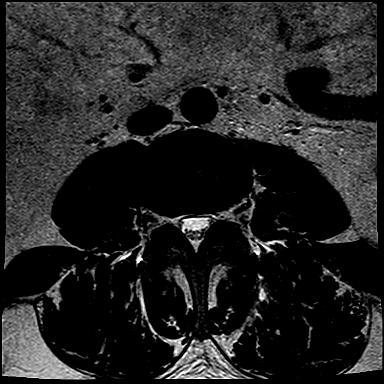
[im 16/23]
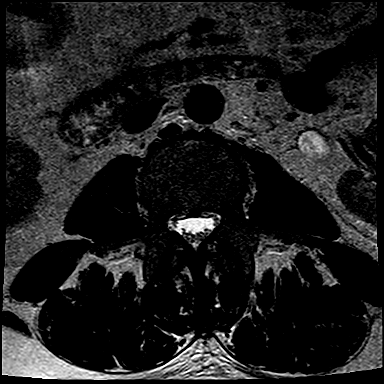
[im 19/23]
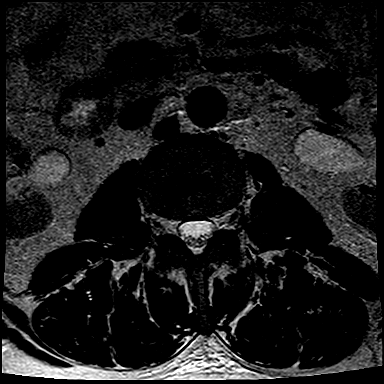
[im 23/23]
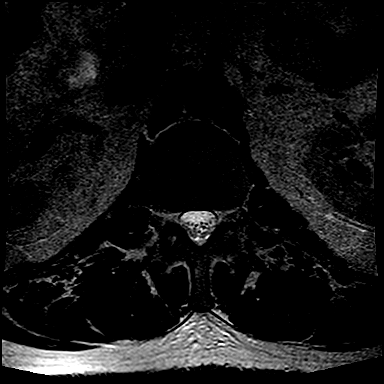

[Series 10: T1 fat-sat · axial · 4.0mm · 0.70mm/px · z∈[-103,+114]mm · 8 of 23 slices shown]
[im 1/23]
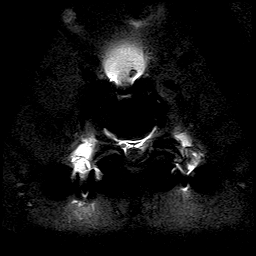
[im 4/23]
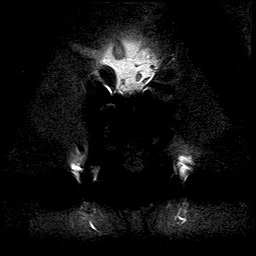
[im 7/23]
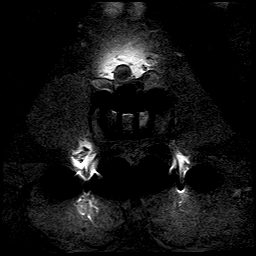
[im 10/23]
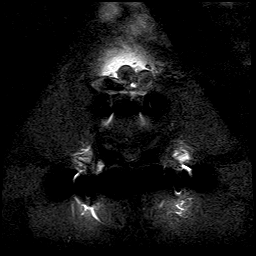
[im 13/23]
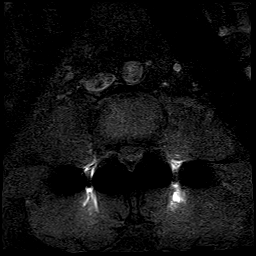
[im 16/23]
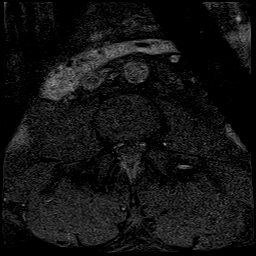
[im 19/23]
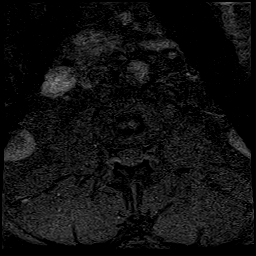
[im 23/23]
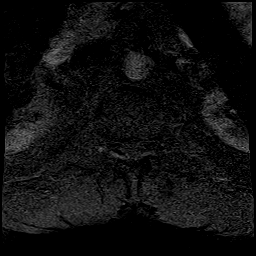

[Series 11: T1 fat-sat post-contrast · sagittal · 4.5mm · 1.05mm/px · 4 of 13 slices shown (1 of 2)]
[im 1/13]
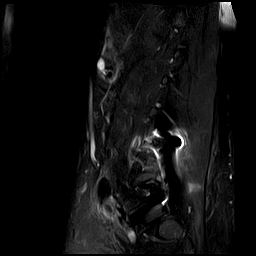
[im 5/13]
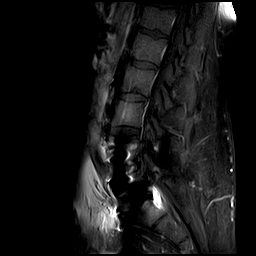
[im 9/13]
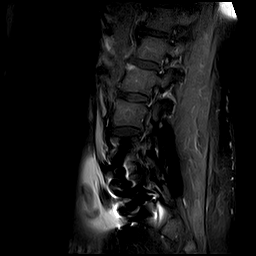
[im 13/13]
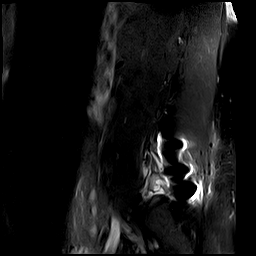

[Series 12: T1 fat-sat post-contrast · axial · 4.0mm · 0.70mm/px · z∈[-103,+114]mm · 8 of 23 slices shown (2 of 2)]
[im 1/23]
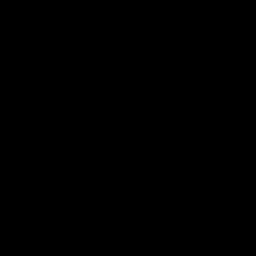
[im 4/23]
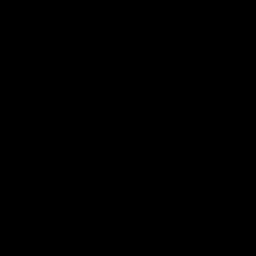
[im 7/23]
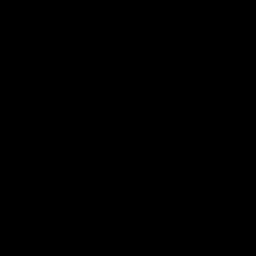
[im 10/23]
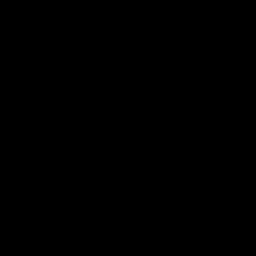
[im 13/23]
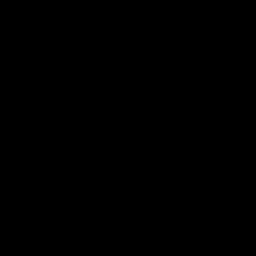
[im 16/23]
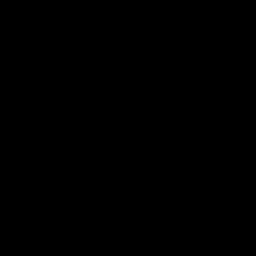
[im 19/23]
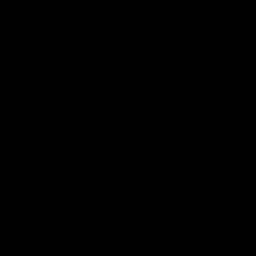
[im 23/23]
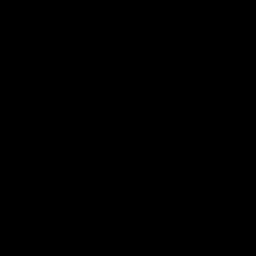

[48 of 48 positions shown; findings below may reference images not displayed]

FINDINGS: Postsurgical changes are noted with metallic hardware transfixing L4-L5-S1.  There is extensive associated metallic artifact degrading image quality and limiting this examination.

The epidural lipomatosis seen previously on September 25, 2020 has improved on the current examination.  There is no significant residual spinal stenosis.  

There is no fracture or malalignment.  No disc herniation or significant disc protrusion is seen.  The conus appears unremarkable.
IMPRESSION: 1. Postsurgical changes.

2. No significant residual spinal stenosis or disc protrusion.

## 2023-05-27 ENCOUNTER — Ambulatory Visit (INDEPENDENT_AMBULATORY_CARE_PROVIDER_SITE_OTHER): Payer: Self-pay | Admitting: Student in an Organized Health Care Education/Training Program

## 2023-06-14 ENCOUNTER — Ambulatory Visit (INDEPENDENT_AMBULATORY_CARE_PROVIDER_SITE_OTHER): Admitting: UROLOGY

## 2023-08-04 ENCOUNTER — Ambulatory Visit (INDEPENDENT_AMBULATORY_CARE_PROVIDER_SITE_OTHER): Payer: Worker's Compensation

## 2023-08-04 ENCOUNTER — Other Ambulatory Visit: Payer: Self-pay

## 2023-08-04 ENCOUNTER — Encounter (INDEPENDENT_AMBULATORY_CARE_PROVIDER_SITE_OTHER): Payer: Self-pay

## 2023-08-04 VITALS — BP 132/81 | HR 63 | Ht 71.5 in | Wt 232.0 lb

## 2023-08-04 DIAGNOSIS — N529 Male erectile dysfunction, unspecified: Secondary | ICD-10-CM

## 2023-08-04 DIAGNOSIS — R7989 Other specified abnormal findings of blood chemistry: Secondary | ICD-10-CM | POA: Insufficient documentation

## 2023-08-04 DIAGNOSIS — E291 Testicular hypofunction: Secondary | ICD-10-CM

## 2023-08-04 MED ORDER — TESTOSTERONE 20.25 MG/1.25 GRAM PER PUMP ACT.(1.62 %) TRANSDERMAL GEL
40.5000 mg | Freq: Every day | TRANSDERMAL | 3 refills | Status: AC
Start: 2023-08-04 — End: ?

## 2023-08-04 NOTE — Progress Notes (Signed)
 UROLOGY, [facility]  [address] [place] [address]       Name: Michael Glenn MRN:  [id number]   Date: 08/04/2023 Age: 63 y.o.         HPI:  LENORD FRALIX is a 63 y.o. White male with a history of a significant back injury who has developed low testosterone  and is currently being managed on AndroGel  2 pumps daily he had seen another provider but was uncomfortable so decided to come to me.  His most recent lab work which was from June 24, 2023 showed a hemoglobin of 18.7 and a hematocrit of 59 total testosterone  was 530 and his free testosterone  was 4.5.  He feels that the medicine is helping him quite a bit.  He still has problems with erectile dysfunction which is most likely secondary to his significant back injury and surgery.  He also complains of some right flank discomfort when he lays on that side but he does have quite a bit of hardware in his back.  He denies any gross hematuria, dysuria or troubling lower urinary tract symptoms.    History reviewed. No pertinent past medical history.   Past Surgical History:   Procedure Laterality Date    HX BACK SURGERY        Social History[1]    Family Medical History:    None        Outpatient Medications Marked as Taking for the 08/04/23 encounter (Office Visit) with Crimson Dubberly E, MD   Medication Sig    esomeprazole magnesium (NEXIUM) 40 mg Oral Capsule, Delayed Release(E.C.)     furosemide (LASIX) 20 mg Oral Tablet     HYDROcodone-acetaminophen (NORCO) 5-325 mg Oral Tablet     potassium chloride (KLOR-CON) 10 mEq Oral Tablet Sustained Release Take 1 Tablet (10 mEq total) by mouth Daily    SINGULAIR 10 mg Oral Tablet Take 1 Tablet (10 mg total) by mouth    SYNTHROID 200 mcg Oral Tablet     testosterone  (ANDROGEL ) 20.25 mg/1.25 gram (1.62 %) Transdermal Gel in Metered-dose Pump Place 41 mg on the skin Daily      Allergies[2]     Review of Systems:   As discussed in HPI, otherwise 11 point review of systems was negative.       Physical Exam:    Vitals:    08/04/23 0947   BP: 132/81   Pulse: 63   Weight: 105 kg (232 lb)   Height: 1.816 m (5' 11.5)   BMI: 31.91         Gen: NAD, alert  Pulm: unlabored at rest  CV: palpable pulses  Abd: soft, Nt/ND  GU: no suprapubic tenderness, mild right back tenderness, but no specific costovertebral angle tenderness      Urinalysis (automated dipstick):    Urine Dip Results:       Urine POCT           Post-void residual, by bladder scan:         PSA History:  PROSTATE SPECIFIC ANTIGEN   No results found for: PROSSPECAG         Data Review:  Pertinent laboratory data and imaging studies reviewed.      I reviewed his most recent lab work including his hemoglobin hematocrit as well as his free and total testosterone       Assessment:  Assessment/Plan   1. Low testosterone     2. Erectile dysfunction, unspecified erectile dysfunction type  Plan:  1. I will refill his AndroGel  today as he seems to be doing reasonably well on that.  He will continue with 2 pumps daily.  2. Patient states that he does reasonably well on the Cialis 20 mg every 3 days.  We will continue this.  He will follow-up in about 3 months and will bring in his most recent lab work at that time.      Orders Placed This Encounter    testosterone  (ANDROGEL ) 20.25 mg/1.25 gram (1.62 %) Transdermal Gel in Metered-dose Pump     No follow-ups on file.        Burnard FORBES Malling, MD    This note may have been fully or partially generated using MModal Fluency Direct system, and there may be some incorrect words, spellings, and punctuation that were not identified in checking the note before saving.       [1]    [2] Not on File

## 2023-10-21 ENCOUNTER — Ambulatory Visit (INDEPENDENT_AMBULATORY_CARE_PROVIDER_SITE_OTHER): Payer: Self-pay

## 2023-10-21 ENCOUNTER — Other Ambulatory Visit: Payer: Self-pay

## 2023-10-21 ENCOUNTER — Encounter (INDEPENDENT_AMBULATORY_CARE_PROVIDER_SITE_OTHER): Payer: Self-pay

## 2023-10-21 VITALS — BP 138/83 | HR 80 | Wt 236.0 lb

## 2023-10-21 DIAGNOSIS — E291 Testicular hypofunction: Secondary | ICD-10-CM

## 2023-10-21 DIAGNOSIS — R7989 Other specified abnormal findings of blood chemistry: Secondary | ICD-10-CM

## 2023-10-21 NOTE — Progress Notes (Signed)
 UROLOGY, [facility]  [address] [place] [address]       Name: Michael Glenn MRN:  [id number]   Date: 10/21/2023 Age: 63 y.o.         HPI:  Michael Glenn is a 63 y.o. 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  He is still using 2 pumps of AndroGel  a day.  Interestingly his testosterone  came back low at 98 ng/dL and his free testosterone  was very low at 0.5 PSA was normal at 1.4 H&H was 17.9 and 53.8.  He was having some lower pelvic pain and got a shot and his hip which has helped.  He does complain of foamy urine but denies passing gas through the urethra.  Urinalysis today was unremarkable with no protein and a postvoid residual was 0.    History reviewed. No pertinent past medical history.   Past Surgical History:   Procedure Laterality Date    HX BACK SURGERY        Social History[1]    Family Medical History:    None        No outpatient medications have been marked as taking for the 10/21/23 encounter (Office Visit) with Sandee Burnard BRAVO, MD.      Allergies[2]     Review of Systems:   As discussed in HPI        Physical Exam:   Vitals:    10/21/23 1016   BP: 138/83   Pulse: 80   Weight: 107 kg (236 lb)         Gen: NAD, alert  Pulm: unlabored at rest  CV: palpable pulses  Abd: soft, Nt/ND  GU: no suprapubic tenderness, no CVAT      Urinalysis (automated dipstick):    Urine Dip Results:  Color (Ref Range: Yellow): Yellow (10/21/23 1000)  Clarity (Ref Range: Clear): Clear (10/21/23 1000)  Time collected: 1025 (10/21/23 1000)  Glucose (Ref Range: Negative mg/dL): Negative (89/82/74 8999)  Bilirubin (Ref Range: Negative mg/dL): Negative (89/82/74 8999)  Ketones (Ref Range: Negative mg/dL): Negative (89/82/74 8999)  Urine Specific Gravity (Ref Range: 1.005 - 1.030): 1.015 (10/21/23 1000)  Blood (urine) (Ref Range: Negative mg/dL): Negative (89/82/74 8999)  pH (Ref Range: 5.0 - 8.0): 6.5 (10/21/23 1000)  Protein (Ref Range: Negative mg/dL): Negative (89/82/74  8999)  Urobilinogen (Ref Range: Normal): 0.2mg /dL (Normal) (89/82/74 8999)  Nitrite (Ref Range: Negative): Negative (10/21/23 1000)  Leukocytes (Ref Range: Negative WBC's/uL): Negative (10/21/23 1000)    Urine POCT  Urine Test  Time collected: 1025 (10/21/23 1000)  PVR Volume: 0 (10/21/23 1000)  Manufacturer: Siemens (10/21/23 1000)  Urine Test - Siemens  Color (Ref Range: Yellow): Yellow (10/21/23 1000)  Clarity (Ref Range: Clear): Clear (10/21/23 1000)  Glucose (Ref Range: Negative mg/dL): Negative (89/82/74 8999)  Bilirubin (Ref Range: Negative mg/dL): Negative (89/82/74 8999)  Ketones (Ref Range: Negative mg/dL): Negative (89/82/74 8999)  Urine Specific Gravity (Ref Range: 1.005 - 1.030): 1.015 (10/21/23 1000)  Blood (urine) (Ref Range: Negative mg/dL): Negative (89/82/74 8999)  pH (Ref Range: 5.0 - 8.0): 6.5 (10/21/23 1000)  Protein (Ref Range: Negative mg/dL): Negative (89/82/74 8999)  Urobilinogen (Ref Range: Normal): 0.2mg /dL (Normal) (89/82/74 8999)  Nitrite (Ref Range: Negative): Negative (10/21/23 1000)  Leukocytes (Ref Range: Negative WBC's/uL): Negative (10/21/23 1000)  PVR Volume: 0 (10/21/23 1000)        Post-void residual, by bladder scan:  PVR Volume: 0 (10/21/23 1000)  PSA History:  PROSTATE SPECIFIC ANTIGEN   No results found for: PROSSPECAG         Data Review:  Pertinent laboratory data and imaging studies reviewed.      Labs      Assessment:  Assessment/Plan   1. Low testosterone          Plan:  Patient is considering going to a testosterone  clinic in [place] and I told him that would be a reasonable option as that is there absolutely specialty in managing low testosterone .  If he needs a referral we can do that for him.  I would like to see him back in 3 months.    No orders of the defined types were placed in this encounter.    No follow-ups on file.        Burnard FORBES Malling, MD    This note may have been fully or partially generated using MModal Fluency Direct system, and there may be some  incorrect words, spellings, and punctuation that were not identified in checking the note before saving.       [1]   Social History  Tobacco Use    Smoking status: Never    Smokeless tobacco: Never   [2] Not on File

## 2024-01-27 ENCOUNTER — Encounter (INDEPENDENT_AMBULATORY_CARE_PROVIDER_SITE_OTHER): Payer: Self-pay
